# Patient Record
Sex: Female | Born: 1960 | Race: White | Hispanic: No | Marital: Married | Smoking: Former smoker
Health system: Southern US, Community
[De-identification: ages and names within clinical notes are randomized; demographics above are authoritative.]

## PROBLEM LIST (undated history)

## (undated) DIAGNOSIS — M810 Age-related osteoporosis without current pathological fracture: Secondary | ICD-10-CM

## (undated) DIAGNOSIS — G43909 Migraine, unspecified, not intractable, without status migrainosus: Secondary | ICD-10-CM

## (undated) DIAGNOSIS — Z862 Personal history of diseases of the blood and blood-forming organs and certain disorders involving the immune mechanism: Secondary | ICD-10-CM

## (undated) DIAGNOSIS — N200 Calculus of kidney: Secondary | ICD-10-CM

## (undated) DIAGNOSIS — G510 Bell's palsy: Secondary | ICD-10-CM

## (undated) DIAGNOSIS — J449 Chronic obstructive pulmonary disease, unspecified: Secondary | ICD-10-CM

## (undated) DIAGNOSIS — N83519 Torsion of ovary and ovarian pedicle, unspecified side: Secondary | ICD-10-CM

## (undated) DIAGNOSIS — K8020 Calculus of gallbladder without cholecystitis without obstruction: Secondary | ICD-10-CM

## (undated) HISTORY — DX: Calculus of kidney: N20.0

## (undated) HISTORY — DX: Bell's palsy: G51.0

## (undated) HISTORY — PX: LAPAROSCOPY: SHX197

## (undated) HISTORY — DX: Calculus of gallbladder without cholecystitis without obstruction: K80.20

## (undated) HISTORY — PX: CHOLECYSTECTOMY: SHX55

## (undated) HISTORY — DX: Torsion of ovary and ovarian pedicle, unspecified side: N83.519

## (undated) HISTORY — PX: TOTAL ABDOMINAL HYSTERECTOMY W/ BILATERAL SALPINGOOPHORECTOMY: SHX83

## (undated) HISTORY — DX: Personal history of diseases of the blood and blood-forming organs and certain disorders involving the immune mechanism: Z86.2

## (undated) HISTORY — PX: APPENDECTOMY: SHX54

## (undated) HISTORY — DX: Age-related osteoporosis without current pathological fracture: M81.0

## (undated) HISTORY — DX: Migraine, unspecified, not intractable, without status migrainosus: G43.909

## (undated) HISTORY — DX: Chronic obstructive pulmonary disease, unspecified: J44.9

---

## 1996-03-09 DIAGNOSIS — G510 Bell's palsy: Secondary | ICD-10-CM

## 1996-03-09 HISTORY — DX: Bell's palsy: G51.0

## 1999-10-13 ENCOUNTER — Other Ambulatory Visit: Admission: RE | Admit: 1999-10-13 | Discharge: 1999-10-13 | Payer: Self-pay | Admitting: Family Medicine

## 2000-02-13 ENCOUNTER — Other Ambulatory Visit: Admission: RE | Admit: 2000-02-13 | Discharge: 2000-02-13 | Payer: Self-pay | Admitting: Obstetrics and Gynecology

## 2003-04-06 ENCOUNTER — Ambulatory Visit (HOSPITAL_COMMUNITY): Admission: RE | Admit: 2003-04-06 | Discharge: 2003-04-06 | Payer: Self-pay | Admitting: Obstetrics and Gynecology

## 2003-04-06 ENCOUNTER — Emergency Department (HOSPITAL_COMMUNITY): Admission: EM | Admit: 2003-04-06 | Discharge: 2003-04-06 | Payer: Self-pay | Admitting: Emergency Medicine

## 2003-04-12 ENCOUNTER — Inpatient Hospital Stay (HOSPITAL_COMMUNITY): Admission: RE | Admit: 2003-04-12 | Discharge: 2003-04-14 | Payer: Self-pay | Admitting: Obstetrics and Gynecology

## 2003-04-12 ENCOUNTER — Encounter (INDEPENDENT_AMBULATORY_CARE_PROVIDER_SITE_OTHER): Payer: Self-pay | Admitting: Specialist

## 2003-04-18 ENCOUNTER — Emergency Department (HOSPITAL_COMMUNITY): Admission: EM | Admit: 2003-04-18 | Discharge: 2003-04-18 | Payer: Self-pay | Admitting: Emergency Medicine

## 2003-07-04 ENCOUNTER — Emergency Department (HOSPITAL_COMMUNITY): Admission: EM | Admit: 2003-07-04 | Discharge: 2003-07-04 | Payer: Self-pay | Admitting: Family Medicine

## 2003-08-06 ENCOUNTER — Ambulatory Visit (HOSPITAL_COMMUNITY): Admission: RE | Admit: 2003-08-06 | Discharge: 2003-08-06 | Payer: Self-pay | Admitting: Obstetrics and Gynecology

## 2004-02-03 ENCOUNTER — Inpatient Hospital Stay (HOSPITAL_COMMUNITY): Admission: AD | Admit: 2004-02-03 | Discharge: 2004-02-03 | Payer: Self-pay | Admitting: Obstetrics and Gynecology

## 2004-02-14 ENCOUNTER — Encounter (INDEPENDENT_AMBULATORY_CARE_PROVIDER_SITE_OTHER): Payer: Self-pay | Admitting: *Deleted

## 2004-02-15 ENCOUNTER — Inpatient Hospital Stay (HOSPITAL_COMMUNITY): Admission: RE | Admit: 2004-02-15 | Discharge: 2004-02-15 | Payer: Self-pay | Admitting: Obstetrics and Gynecology

## 2004-08-21 ENCOUNTER — Ambulatory Visit: Payer: Self-pay | Admitting: Family Medicine

## 2004-08-29 ENCOUNTER — Ambulatory Visit: Payer: Self-pay | Admitting: Family Medicine

## 2004-09-08 ENCOUNTER — Ambulatory Visit: Payer: Self-pay | Admitting: Family Medicine

## 2004-11-25 ENCOUNTER — Ambulatory Visit: Payer: Self-pay | Admitting: Family Medicine

## 2005-06-23 ENCOUNTER — Emergency Department (HOSPITAL_COMMUNITY): Admission: EM | Admit: 2005-06-23 | Discharge: 2005-06-24 | Payer: Self-pay | Admitting: Emergency Medicine

## 2005-08-07 ENCOUNTER — Encounter: Admission: RE | Admit: 2005-08-07 | Discharge: 2005-08-07 | Payer: Self-pay | Admitting: Obstetrics and Gynecology

## 2005-11-12 ENCOUNTER — Ambulatory Visit: Payer: Self-pay | Admitting: Family Medicine

## 2005-12-10 ENCOUNTER — Encounter (INDEPENDENT_AMBULATORY_CARE_PROVIDER_SITE_OTHER): Payer: Self-pay | Admitting: Specialist

## 2005-12-10 ENCOUNTER — Ambulatory Visit (HOSPITAL_COMMUNITY): Admission: RE | Admit: 2005-12-10 | Discharge: 2005-12-10 | Payer: Self-pay | Admitting: General Surgery

## 2007-06-20 ENCOUNTER — Encounter (INDEPENDENT_AMBULATORY_CARE_PROVIDER_SITE_OTHER): Payer: Self-pay | Admitting: Internal Medicine

## 2007-06-20 ENCOUNTER — Ambulatory Visit: Payer: Self-pay | Admitting: Family Medicine

## 2007-06-20 DIAGNOSIS — F17200 Nicotine dependence, unspecified, uncomplicated: Secondary | ICD-10-CM | POA: Insufficient documentation

## 2007-06-20 DIAGNOSIS — M810 Age-related osteoporosis without current pathological fracture: Secondary | ICD-10-CM | POA: Insufficient documentation

## 2007-06-20 DIAGNOSIS — D649 Anemia, unspecified: Secondary | ICD-10-CM

## 2007-06-21 LAB — CONVERTED CEMR LAB
Basophils Absolute: 0 10*3/uL (ref 0.0–0.1)
Basophils Relative: 0.3 % (ref 0.0–1.0)
Eosinophils Absolute: 0.1 10*3/uL (ref 0.0–0.7)
Eosinophils Relative: 1.3 % (ref 0.0–5.0)
HCT: 44.8 % (ref 36.0–46.0)
Hemoglobin: 15.2 g/dL — ABNORMAL HIGH (ref 12.0–15.0)
Lymphocytes Relative: 44.3 % (ref 12.0–46.0)
MCHC: 34 g/dL (ref 30.0–36.0)
MCV: 95.9 fL (ref 78.0–100.0)
Monocytes Absolute: 0.5 10*3/uL (ref 0.1–1.0)
Monocytes Relative: 6 % (ref 3.0–12.0)
Neutro Abs: 3.9 10*3/uL (ref 1.4–7.7)
Neutrophils Relative %: 48.1 % (ref 43.0–77.0)
Platelets: 206 10*3/uL (ref 150–400)
RBC: 4.67 M/uL (ref 3.87–5.11)
RDW: 12.9 % (ref 11.5–14.6)
WBC: 8 10*3/uL (ref 4.5–10.5)

## 2007-06-23 LAB — CONVERTED CEMR LAB: Vit D, 1,25-Dihydroxy: 36 (ref 30–89)

## 2007-07-26 ENCOUNTER — Encounter (INDEPENDENT_AMBULATORY_CARE_PROVIDER_SITE_OTHER): Payer: Self-pay | Admitting: *Deleted

## 2007-12-01 ENCOUNTER — Telehealth: Payer: Self-pay | Admitting: Internal Medicine

## 2008-12-26 ENCOUNTER — Ambulatory Visit: Payer: Self-pay | Admitting: Family Medicine

## 2008-12-26 DIAGNOSIS — H8110 Benign paroxysmal vertigo, unspecified ear: Secondary | ICD-10-CM

## 2009-11-27 ENCOUNTER — Ambulatory Visit: Payer: Self-pay | Admitting: Internal Medicine

## 2009-11-27 DIAGNOSIS — M79609 Pain in unspecified limb: Secondary | ICD-10-CM

## 2009-11-28 ENCOUNTER — Encounter: Payer: Self-pay | Admitting: Family Medicine

## 2009-11-28 ENCOUNTER — Ambulatory Visit: Payer: Self-pay

## 2009-11-28 LAB — CONVERTED CEMR LAB
BUN: 8 mg/dL (ref 6–23)
Basophils Absolute: 0.1 10*3/uL (ref 0.0–0.1)
Basophils Relative: 0.9 % (ref 0.0–3.0)
CO2: 29 meq/L (ref 19–32)
Calcium: 10.2 mg/dL (ref 8.4–10.5)
Chloride: 107 meq/L (ref 96–112)
Creatinine, Ser: 0.7 mg/dL (ref 0.4–1.2)
Eosinophils Absolute: 0.1 10*3/uL (ref 0.0–0.7)
Eosinophils Relative: 1.1 % (ref 0.0–5.0)
GFR calc non Af Amer: 92.37 mL/min (ref >60)
Glucose, Bld: 99 mg/dL (ref 70–99)
HCT: 43.1 % (ref 36.0–46.0)
Hemoglobin: 14.9 g/dL (ref 12.0–15.0)
Lymphocytes Relative: 30.3 % (ref 12.0–46.0)
Lymphs Abs: 2.7 10*3/uL (ref 0.7–4.0)
MCHC: 34.5 g/dL (ref 30.0–36.0)
MCV: 96.4 fL (ref 78.0–100.0)
Monocytes Absolute: 0.7 10*3/uL (ref 0.1–1.0)
Monocytes Relative: 7.3 % (ref 3.0–12.0)
Neutro Abs: 5.5 10*3/uL (ref 1.4–7.7)
Neutrophils Relative %: 60.4 % (ref 43.0–77.0)
Platelets: 216 10*3/uL (ref 150.0–400.0)
Potassium: 5.4 meq/L — ABNORMAL HIGH (ref 3.5–5.1)
RBC: 4.47 M/uL (ref 3.87–5.11)
RDW: 13.6 % (ref 11.5–14.6)
Sodium: 142 meq/L (ref 135–145)
TSH: 0.9 microintl units/mL (ref 0.35–5.50)
Total CK: 77 units/L (ref 7–177)
WBC: 9.1 10*3/uL (ref 4.5–10.5)

## 2009-12-05 ENCOUNTER — Ambulatory Visit: Payer: Self-pay | Admitting: Internal Medicine

## 2009-12-05 LAB — CONVERTED CEMR LAB: Potassium: 4.8 meq/L (ref 3.5–5.1)

## 2009-12-11 ENCOUNTER — Ambulatory Visit: Payer: Self-pay | Admitting: Internal Medicine

## 2009-12-12 ENCOUNTER — Telehealth: Payer: Self-pay | Admitting: Family Medicine

## 2010-04-08 NOTE — Progress Notes (Signed)
Summary: regarding chantix script  Phone Note From Pharmacy   Caller: [facility]  [address] [id number] Summary of Call: Pharmacy is calling for verification on chantix script.  They are asking if you want the starter pack or continuing month pack, they say directions are for starter pack but continuing pack was sent in. Initial call taken by: Lowella Petties CMA,  December 12, 2009 2:57 PM  Follow-up for Phone Call        I want continuing pack with directions sent in.  pt did not tolerate previous 1mg  dose two times a day.  ok to give loose pills in bottle. Follow-up by: Eustaquio Boyden  MD,  December 12, 2009 3:02 PM  Additional Follow-up for Phone Call Additional follow up Details #1::        Advised pharmacist, asked if ok to give loose pills in a bottle ranther than a pack.  Advised yes, per Dr. Reece Agar. Additional Follow-up by: Lowella Petties CMA,  December 12, 2009 3:09 PM

## 2010-04-08 NOTE — Miscellaneous (Signed)
Summary: Orders Update  Clinical Lists Changes  Problems: Added new problem of PVD (ICD-443.9) Orders: Added new Test order of Arterial Duplex Lower Extremity (Arterial Duplex Low) - Signed 

## 2010-04-08 NOTE — Assessment & Plan Note (Signed)
Summary: 2-3 WEEK FOLLOW UP ON LEGS/RBH   Vital Signs:  Patient profile:   50 year old female Weight:      110.50 pounds Temp:     98.4 degrees F oral Pulse rate:   60 / minute Pulse rhythm:   regular BP sitting:   104 / 62  (left arm) Cuff size:   regular  Vitals Entered By: Selena Batten Dance CMA (AAMA) (December 11, 2009 9:21 AM) CC: 2-3 week follow-up   History of Present Illness: CC: f/u leg pain L>R  1+ year h/o leg pain.  Recently worse over last weekned.  Still waking up in sleep.  Worse at night.  No sensation of restless legs.  L>R.  Doesn't feel "muscular" no joint pains.  No paresthesias, not charley horse sensation.  Pain dull achey throbby.  No significant leg swelling, no recent falls, trauma, etc.    Last visit sent for ABIs normal, basic blood work normal.  prescribed compression stockings, feels doing better but still in pain.  At night worse, uses heating pad for relief.  Thinks worse in L popliteal fossa area.    Smoker - 1/2 ppd.  interested in quitting.  No h/o DM, HTN.  osetoporosis - h/o using fosamax consistently x 2+ years.  asks about restarting.  has been off for 1+ year  Allergies: 1)  ! Penicillin  Past History:  Social History: Last updated: 11/27/2009 1/2 ppd smoking, no EtOH. Marital Status: Married Children: 2 Occupation: Theatre manager at [employer], broadcasting/film/video  Past Medical History: h/o migraines h/o osteoporosis Bell's palsy 1998 h/o nephrolithiasis h/o cholelithiasis h/o anemia (resolved as of 2011) PMH-FH-SH reviewed for relevance  Review of Systems       per HPI  Physical Exam  General:  alert, well-developed, well-nourished, and well-hydrated.  NAD Msk:  No deformity or scoliosis noted of thoracic or lumbar spine.  No tenderness at muscles of BLE, FROM at knees and hips. Pulses:  2+ DP/PT bilaterally Extremities:  no edema.  + marked vessels L>R legs.  no erythema or hyperpigmentation of legs.  Nontender to palpation.  No evidence  of baker's cyst bilaterally, negative homan sign, no palpable cords. Neurologic:  sensation intact bilaterally Skin:  Intact without suspicious lesions or rashes   Impression & Recommendations:  Problem # 1:  LEG PAIN, LEFT (ICD-729.5) ABIs f/o PAD.  ? vascular insufficiency pain 2/2 smoking.  slight improvement with compression stockings.  rec start horse chestnut seed extract daily, limit advil use given bleeding risk with both.  if not improving with these measures, consider [facility] referral for dopplers vs [place] looking for baker's cyst.  Problem # 2:  TOBACCO ABUSE (ICD-305.1) pt willing to retry chantix, at lower dose hopefully to circumvent previously nausea side effect.  rtc 1 mo for f/u.  Her updated medication list for this problem includes:    Chantix Continuing Month Pak 1 Mg Tabs (Varenicline tartrate) ..... Start with 1/2 pill daily for 3 days then 1/2 pill twice daily  Problem # 3:  OSTEOPOROSIS (ICD-733.00) hold off on restarting fosamax, concentrate on smoking cessation for now.  consider restarting in 1 year for another 2-3 years of bisphosphonate protection, once smoking resolved.  Her updated medication list for this problem includes:    Sm Calcium 600/vitamin D 600-400 Mg-unit Tabs (Calcium carbonate-vitamin d) .Marland Kitchen... Take one twice daily  Vit D:36 (06/20/2007)  Complete Medication List: 1)  Multivitamins Tabs (Multiple vitamin) .... Daily as remembers 2)  Compression Stockings Bilateral  ..Marland KitchenMarland Kitchen  20 mmhg 3)  Sm Calcium 600/vitamin D 600-400 Mg-unit Tabs (Calcium carbonate-vitamin d) .... Take one twice daily 4)  Chantix Continuing Month Pak 1 Mg Tabs (Varenicline tartrate) .... Start with 1/2 pill daily for 3 days then 1/2 pill twice daily 5)  H[employer]  .... 300mg  daily  Patient Instructions: 1)  return in 1 month for f/u on smoking and chantix. 2)  ABIs normal, blood work normal.  (copy provided) 3)  This could be vascular.  For this I recommend cutting  back on smoking as much as you can. 4)  Also consider horse chestnut seed extract (50mg  escin daily). 5)  Sent in script for chantix - take 1/2 pill twice daily 6)  We will hold off on fosamax for now.  Consider restarting in 1 year. Prescriptions: CHANTIX CONTINUING MONTH PAK 1 MG TABS (VARENICLINE TARTRATE) start with 1/2 pill daily for 3 days then 1/2 pill twice daily  #30 x 1   Entered and Authorized by:   Eustaquio Boyden  MD   Signed by:   Eustaquio Boyden  MD on 12/11/2009   Method used:   [facility]  [address] [phone] ([facility]ail[facility]       [address]       [place], [place]  [address]       Ph: [phone]       Fax: [phone]   RxID:   [id number]   Current Allergies (reviewed today): ! PENICILLIN

## 2010-04-08 NOTE — Assessment & Plan Note (Signed)
Summary: PAIN IN LEGS/BILLIE'S PT/CLE   Vital Signs:  Patient profile:   50 year old female Height:      63.75 inches Weight:      112 pounds BMI:     19.45 Temp:     98.0 degrees F oral Pulse rate:   72 / minute Pulse rhythm:   regular BP sitting:   110 / 80  (left arm) Cuff size:   regular  Vitals Entered By: Selena Batten Dance CMA Duncan Dull) (November 27, 2009 8:31 AM) CC: Leg pain Comments Left >right leg pain-doesn't feel muscular;patient feels that it is vascular in nature   History of Present Illness: CC: ?trouble ciruclation in legs L > R  1+ year h/o leg pain.  Recently worse over last weekned.  Wakes up in sleep.  Worse at night.  No sensation of restless legs.  L>R.  Doesn't feel "muscular" no joint pains.  No paresthesias, not charley horse sensation.  Pain dull achey throbby.  No significant leg swelling, no recent falls, trauma, etc.    Smoker - 1/2 ppd.  No h/o DM, HTN.  Current Medications (verified): 1)  None  Allergies: 1)  ! Penicillin  Past History:  Past Medical History: h/o anemia h/o migraines h/o osteoporosis  Past Surgical History: C/S x 2 hysterectomy with BSO (for ovarian torsion) Cholecystectomy  Social History: 1/2 ppd smoking, no EtOH. Marital Status: Married Children: 2 Occupation: Theatre manager at [employer], broadcasting/film/video  Review of Systems       per HPI  Physical Exam  General:  alert, well-developed, well-nourished, and well-hydrated.  NAD Lungs:  Normal respiratory effort, chest expands symmetrically. Lungs are clear to auscultation, no crackles or wheezes. Heart:  Normal rate and regular rhythm. S1 and S2 normal without gallop, murmur, click, rub or other extra sounds. Msk:  No deformity or scoliosis noted of thoracic or lumbar spine.  No tenderness at muscles of BLE, FROM at knees and hips. Pulses:  2+ DP/PT RLE, 2- DP/PT LLE Extremities:  no edema.  + marked vessels L>R legs.  no erythema or hyperpigmentation of legs.  Nontender to  palpation.  No evidence of baker's cyst bilaterally, negative homan sign, no palpable cords. Neurologic:  sensation intact bilaterally Skin:  Intact without suspicious lesions or rashes   Impression & Recommendations:  Problem # 1:  LEG PAIN, LEFT (ICD-729.5) Unsure etiology. Slightly diminised pulses L leg compared to R so sent for ABI (also given h/o smoker).  ? if venous insufficiency so have given prescription for compression stockings as well as instructed to keep legs elevated, drink plenty of fluids.  Check basic blood work to help r/o other causes (ie RLS, thyroid dysfunction, myalgias.)  RTC in 1 mo for f/u.  Orders: TLB-CBC Platelet - w/Differential (85025-CBCD) TLB-BMP (Basic Metabolic Panel-BMET) (80048-METABOL) TLB-TSH (Thyroid Stimulating Hormone) (84443-TSH) TLB-CK Total Only(Creatine Kinase/CPK) (82550-CK) Cardiology Referral (Cardiology)  Problem # 2:  OSTEOPOROSIS (ICD-733.00) rec cal/vit d daily.  The following medications were removed from the medication list:    Fosamax 70 Mg Tabs (Alendronate sodium) .Marland Kitchen... 1 x weekly for bones, take on empty stomach with 6-8oz water, remain upright for , may eat after Her updated medication list for this problem includes:    Sm Calcium 600/vitamin D 600-400 Mg-unit Tabs (Calcium carbonate-vitamin d) .Marland Kitchen... Take one twice daily  Vit D:36 (06/20/2007)  Problem # 3:  TOBACCO ABUSE (ICD-305.1)  Encouraged smoking cessation.  previously used chantix but made her nauseated so stopped (never tried 1/2  dose).  Problem # 4:  ANEMIA (ICD-285.9) h/o anemia - check again today (help r/o RLS).  If normal, could remove from problem list.  Orders: TLB-CBC Platelet - w/Differential (85025-CBCD)  Hgb: 15.2 (06/20/2007)   Hct: 44.8 (06/20/2007)   Platelets: 206 (06/20/2007) RBC: 4.67 (06/20/2007)   RDW: 12.9 (06/20/2007)   WBC: 8.0 (06/20/2007) MCV: 95.9 (06/20/2007)   MCHC: 34.0 (06/20/2007)  Complete Medication List: 1)   Multivitamins Tabs (Multiple vitamin) .... Daily as remembers 2)  Compression Stockings Bilateral  .... 20 mmhg 3)  Sm Calcium 600/vitamin D 600-400 Mg-unit Tabs (Calcium carbonate-vitamin d) .... Take one twice daily  Patient Instructions: 1)  Please return in 2-3 weeks for follow up on legs. 2)  I'm not quite sure what's causing your leg pain.   3)  We will draw blood today and send you to get ABIs done. 4)  Try elevating legs first thing you get home (and at work if possible). 5)  Also try compression stockings (prescription provided today) and stay away from salty foods. 6)  Smoking cessation is key to helping veins function properly.  [email or web address] for assistance, let us know if you want more help as well. 7)  Pleasure to meet you today.  Call clinic with questions. Prescriptions: COMPRESSION STOCKINGS BILATERAL 20 mmHg  #1 x 1   Entered and Authorized by:   Eustaquio Boyden  MD   Signed by:   Eustaquio Boyden  MD on 11/27/2009   Method used:   Print then Give to Patient   RxID:   [id number]   Current Allergies (reviewed today): ! PENICILLIN  Appended Document: old chart input    Clinical Lists Changes  Observations: Added new observation of PAST SURG HX: C/S x 2 hysterectomy with BSO (for ovarian torsion) Cholecystectomy  DEXA osteoporosis 09/2005 DEXA osteoporosis 08/2004 MRI Brain - Bells palsy 1998 (11/27/2009 17:55) Added new observation of PAST MED HX: h/o anemia h/o migraines h/o osteoporosis Bell's palsy 1998 h/o nephrolithiasis h/o cholelithiasis (11/27/2009 17:55) Added new observation of CALCIUM: 10.1 mg/dL (86/57/8469 62:95) Added new observation of ALBUMIN: 4.3 g/dL (28/41/3244 01:02) Added new observation of PROTEIN, TOT: 7.0 g/dL (72/53/6644 03:47) Added new observation of SGPT (ALT): 19 units/L (08/21/2004 17:55) Added new observation of SGOT (AST): 22 units/L (08/21/2004 17:55) Added new observation of ALK PHOS: 82 units/L (08/21/2004  17:55) Added new observation of BILI TOTAL: 0.7 mg/dL (42/59/5638 75:64) Added new observation of CREATININE: 0.9 mg/dL (33/29/5188 41:66) Added new observation of BUN: 6 mg/dL (09/06/1599 09:32) Added new observation of BG RANDOM: 92 mg/dL (35/57/3220 25:42) Added new observation of CL SERUM: 103 meq/L (08/21/2004 17:55) Added new observation of K SERUM: 4.6 meq/L (08/21/2004 17:55) Added new observation of NA: 138 meq/L (08/21/2004 17:55) Added new observation of PAP SMEAR: ASCUS  (10/13/1999 18:00)        -  Date:  10/13/1999    PAP ASCUS   Past History:  Past Medical History: h/o anemia h/o migraines h/o osteoporosis Bell's palsy 1998 h/o nephrolithiasis h/o cholelithiasis  Past Surgical History: C/S x 2 hysterectomy with BSO (for ovarian torsion) Cholecystectomy  DEXA osteoporosis 09/2005 DEXA osteoporosis 08/2004 MRI Brain - Bells palsy 1998

## 2010-06-04 ENCOUNTER — Encounter: Payer: Self-pay | Admitting: Family Medicine

## 2010-07-08 HISTORY — PX: LAPAROSCOPIC APPENDECTOMY: SUR753

## 2010-07-25 NOTE — Op Note (Signed)
NAME:  Sonya Hogan, Sonya Hogan                      ACCOUNT NO.:  [id number]   MEDICAL RECORD NO.:  [id number]                   PATIENT TYPE:  INP   LOCATION:  0005                                 FACILITY:  [facility]   PHYSICIAN:  Malachi Pro. Ambrose Mantle, M.D.              DATE OF BIRTH:  Feb 15, 1961   DATE OF PROCEDURE:  04/12/2003  DATE OF DISCHARGE:                                 OPERATIVE REPORT   PREOPERATIVE DIAGNOSES:  1. Menorrhagia.  2. Anemia.  3. Large pelvic mass, thought to be coming from the right ovary.  4. Cholelithiasis.  5. Possible right ureteral stone.   POSTOPERATIVE DIAGNOSES:  1. Menorrhagia.  2. Anemia.  3. Large pelvic mass, thought to be coming from the right ovary.  4. Possible right ureteral stone.  5. There was a torsion of the right tube and ovary causing necrosis of the     right tube and ovary.  6. Cholelithiasis.  7. No apparent dilatation of the right ureter.   OPERATION:  1. Abdominal hysterectomy.  2. Right salpingo-oophorectomy.   OPERATOR:  Malachi Pro. Ambrose Mantle, M.D.   ASSISTANT:  Zenaida Niece, M.D.   ANESTHESIA:  General.   DESCRIPTION OF PROCEDURE:  The patient was brought to the operating room and  placed under satisfactory general anesthesia, placed in frogleg position.  The abdomen, vulva, vagina, and urethra were prepped with Betadine solution,  and the bladder was catheterized with a Foley catheter hooked to straight  drain.  The patient was placed supine.  The midline incision was made and  carried in layers through the skin, subcutaneous tissue, and fascia and into  the peritoneal cavity after the area was draped as a sterile field.  There  was some bloody acidic fluid on entering the abdominal cavity.  Exploration  of the upper abdomen revealed the liver to be smooth without abnormalities.  The gallbladder had small stones present.  Both kidneys felt normal.  Exploration of the pelvis revealed the uterus to be anterior, normal size.  The left tube and ovary were normal except the left tube had been previously  ligated.  The right tube and ovary were twisted close to the uterotubal and  uteroovarian junction.  The mass combining the right tube and ovary was  probably about 10 x 6-8 cm.  The colon was a dark maroon color indicating  necrosis.  The posterior cul-de-sac was free of disease.  The anterior cul-  de-sac was markedly scarred secondary to her previous C-sections.  The right  round ligament was divided.  The right infundibulopelvic ligament was  divided between clamps superior to the ureter.  The right tube and ovary  were then removed by transecting the uteroovarian ligament and the  mesosalpinx between clamps.  The specimen was sent for frozen section.  There was a clot present in the vessels but not in the infundibulopelvic  ligament as far as I could tell.  Both uterine vessels were skeletonized,  clamped, cut, and suture ligated.  Parametrial tissues were clamped, cut,  and suture ligated, and the uterosacral ligaments were clamped, cut, suture  ligated, and held.  The right vaginal angle was entered after the rectum was  pushed posteriorly, and the bladder was pushed well inferior to the  operative field.  The left vaginal angle was entered, and then the uterus  was removed by transecting the upper vagina.  Vaginal angle sutures were  placed, and then the central portion of the vagina was closed with  interrupted figure-of-eight sutures of 0 Vicryl.  There was very little  bleeding.  A couple of spots required electrical current to complete  hemostasis.  The uterosacral ligaments were sutured together in the midline  with two interrupted sutures of 0 Vicryl.  The distal left fallopian tube  and the left ovary looked normal.  Reperitonealization was done across the  vaginal cuff.  Both ureters were palpated and visualized and were normal.  Frozen section report came back as no evidence of tumor.  The tissue  was so  edematous and necrotic, it was hard to tell the exact nature of the tissue,  but there was no evidence of any tumor formation or malignancy.  Pelvic  washings which had been obtained on entering the abdominal cavity were  discarded.  Packs and retractors were removed, and the abdominal wall was  closed with Marcello Moores sutures of #1 Novofil in a far-far-near-near type  pattern, and then a couple of extra sutures were placed through the fascia  in areas where the Tom Jones sutures were not close together.  The  subcutaneous tissue was closed with a running 3-0 Vicryl, and the skin was  closed with automatic staples.  The patient seemed to tolerate the procedure  well and was returned to recovery in satisfactory condition.  Dr. Thornton Dales had been on standby in case nodal tissue was necessary in case of  malignancy.  He was informed at the time of the frozen section that we no  longer needed his services but that I would have the patient see him  regarding her cholelithiasis.  Blood loss about 100 mL.                                               Malachi Pro. Ambrose Mantle, M.D.    TFH/MEDQ  D:  04/12/2003  T:  04/12/2003  Job:  [id number]   cc:   Anselm Pancoast. Zachery Dakins, M.D.  [address], [address]  [place]  [place] [address]  Fax: [phone]

## 2010-07-25 NOTE — H&P (Signed)
NAME:  Sonya Hogan, Sonya Hogan                      ACCOUNT NO.:  [id number]   MEDICAL RECORD NO.:  [id number]                   PATIENT TYPE:  INP   LOCATION:  NA                                   FACILITY:  [facility]   PHYSICIAN:  Malachi Pro. Ambrose Mantle, M.D.              DATE OF BIRTH:  06/18/1960   DATE OF ADMISSION:  DATE OF DISCHARGE:                                HISTORY & PHYSICAL   ANTICIPATED DATE OF ADMISSION:  April 12, 2003.   HISTORY OF PRESENT ILLNESS:  This patient is a 50 year old white married  female para 2, 0, 0, 2 seen with right lower quadrant abdominal pain of  seven hours duration on April 06, 2003.  The patient had the acute onset  of sudden right lower abdominal pain in the morning of April 06, 2003.  This caused almost to black out and she had associated nausea and vomiting.  Her last period had 10 days before, lasting six days and medium flow;  previous menstrual period one month before that for six days, very heavy for  five to eight pads per day, and she passed some golf ball-sized clots.  After the onset of the patient's pain on April 06, 2003 [facility] and there she received a CT scan that showed a pelvic mass  most likely arising from the right ovary.  She also was noted to have mild  right hydroureter and a possible calculus in the distal right ureter.  She  also had had swelling of the right S2 nerve root sleeve.  When I was called  by the [facility] physician I was not told about the hydroureter or  the swelling of the S2 nerve root sleeve, only the pelvic mass.  The patient  came to my office where she was in considerable pain.  I sent her to [facility] where she underwent an ultrasound exam that showed a complex cystic  and solid lesion in the right adnexa.  No definite tubular structure could  be identified to suggest a pyosalpinx of tubo-ovarian abscess.  It was not  hyperemic, although there was a detectable  Doppler signal within the non  cystic components of the lesion; cystic ovarian neoplasm was considered.  The features of the mass were not felt to be consistent with ovarian  torsion.  The patient was advised to proceed with surgery and she is  admitted for that now.   ALLERGIES:  Allergy to PENICILLIN.   PAST SURGICAL HISTORY:  Tubal ligation, conization and C section times two.   PAST MEDICAL HISTORY:  Bell's palsy seven years ago.   HABITS:  The patient drinks occasional alcohol. Smokes 1.2 pack of  cigarettes a day.  She does not use drugs.   MEDICATIONS:  Advil.   FAMILY HISTORY:  Mother 18 living and well.  Father 65 living and well.  Two  brothers; one died  at age 18 of testicular cancer.   REVIEW OF SYSTEMS:  The patient does have migraines.  She coughs frequently.  She has no intestinal or urinary problems.   PHYSICAL EXAMINATION:  VITAL SIGNS:  Physical exam reveals the patient is  afebrile with normal vital signs.  HEENT:  Head, eyes, ears, nose and throat reveal no cranial abnormalities.  Extraocular movements are intact.  Nose and pharynx clear.  There is an  upper dental plate.  NECK:  Neck supple without thyromegaly.  HEART:  Normal size and sounds.  No murmurs.  LUNGS:  Lungs clear to P&A.  ABDOMEN:  Abdomen is soft and not tender.  There is no rebound tenderness.  No mass is palpable.  PELVIC EXAMINATION:  Vulva and vagina are clean.  BUS negative.  Cervix is  clear.  Uterus anterior, normal size.  There is a cul-de-sac mass thought to  be about 5 cm in diameter, although by ultrasound it was thought to be about  9.2 x 2.3 x 6.4 cm.   ADMITTING IMPRESSION:  1. Right lower abdominal pain.  2. Pelvic mass.  3. Also, it should be noted that on the patient's computerized tomographic     scan she was noted to have gallstones, which were not reported to be on     the report from the emergency room.   The patient has been informed of the impending surgery.  She  is to make a  decision if the lesion is benign whether to remove just the pelvic mass or  whether to also remove the uterus.  She knows that if it is malignant that a  hysterectomy, bilateral salpingo-oophorectomy, omentectomy, and lymph node  biopsies will be done.  She knows that Dr. Zachery Dakins is standing by for  assistance.  She has also been informed that if her pain persists after the  mass is removed that she will need to undergo evaluation for a right  ureteral stone.  She understands that the gallstones might be removed, but  probably not and that she will need further evaluation of the swelling of  the right S2 nerve root sleeve.  By CT scan there does not appear to be any  omental caking, there is no ascites and no lymphadenopathy.  The patient  understands the risks of surgery including, but not limited to heart attack,  stroke, pulmonary embolus, wound disruption, hemorrhage with need for  reoperation and/or transfusion, fistula formation, nerve injury, and  intestinal obstruction.  She also understands the potential for infection  and she is ready to proceed.                                               Malachi Pro. Ambrose Mantle, M.D.    TFH/MEDQ  D:  04/11/2003  T:  04/11/2003  Job:  [id number]

## 2010-07-25 NOTE — Op Note (Signed)
Sonya Hogan, Sonya Hogan            ACCOUNT NO.:  [id number]   MEDICAL RECORD NO.:  [id number]          PATIENT TYPE:  AMB   LOCATION:  DAY                          FACILITY:  [facility]   PHYSICIAN:  Anselm Pancoast. Weatherly, M.D.DATE OF BIRTH:  1960/12/02   DATE OF PROCEDURE:  12/10/2005  DATE OF DISCHARGE:  12/10/2005                                 OPERATIVE REPORT   PREOPERATIVE DIAGNOSIS:  Chronic cholecystitis with stones.   POSTOPERATIVE DIAGNOSIS:  Chronic cholecystitis with stones.   OPERATION:  Laparoscopic cholecystectomy with cholangiogram.   SURGEON:  Consuello Bossier, M.D.   ASSISTANT:  Kendrick Ranch, M.D.   ANESTHESIA:  General anesthesia.   HISTORY:  Sonya Hogan is a 50 year old Caucasian female who was  referred to me for recurrent episodes of right upper quadrant pain.  Approximately 2 years ago, she had had problems with an ovarian cyst and  this ultimately had a abdominal hysterectomy with bilateral salpingo-  oophorectomy by Dr. Ambrose Mantle and she now is having episodes of right upper  quadrant pain certainly consistent with episodes of gallbladder attacks.  I  saw her and reviewed her CT that she had done 2 years ago and she has  obvious stones within her gallbladder with calcium at that time.  Her  symptoms were fairly classical for episodes of biliary colic and I  recommended we just proceed on with a laparoscopic cholecystectomy and  cholangiogram.  Repeat CBC and liver function studies are normal.  Intermittently, she will have some kind of cramping in the right lower  quadrant, but nothing of any significance and she is completely asymptomatic  in the lower abdominal tenderness at this time.   DESCRIPTION OF PROCEDURE:  I recommended we proceed on and preop, she was  given a gram of Kefzol and positioned on the OR table with induction of  general anesthesia.  An endotracheal tube was placed and an oral tube into  the abdomen and the abdomen was prepped with  Betadine surgical scrubbing  solution and draped in a sterile manner.  She had had a lower midline  incision and I opened up the immediate subumbilical area, identified the  little suture line and actually removed one of the little Prolene sutures as  we carefully entered into the peritoneal cavity.  She does have adhesions at  the midline, but we were up above that and a pursestring suture of 0 Vicryl  was placed and a Hasson cannula introduced.  The gallbladder was collapsed.  There were adhesions around it and these were carefully taken down and then  the upper 10-mL trocar had been placed under direct vision after  anesthetizing the fascia and the 2 lateral 5-mm trocars were placed in the  appropriate position by Dr. Earlene Plater.  The gallbladder was retracted upward and  outward.  The adhesions were carefully divided with hook electrocautery and  then the proximal cystic duct junction of the gallbladder was identified.  The cystic artery was likewise identified and I put a clip on it proximally  and then encompassed the cystic duct, clipped it at the junction of the  gallbladder and  then a little opening made.  A Cook catheter was introduced,  held in place with a clip; x-ray was obtained and it showed good prompt  filling of the extrahepatic biliary system with good flow into the duodenum  and the intrahepatic radicles were visualized nicely.  I removed catheter,  triply clipped the cystic duct under direct vision, divided it, put an  additional clip on the cystic artery proximally and then divided the cystic  artery distal to the 2 clips, then I also identified a little posterior  branch of the cystic artery that was doubly clipped proximally and divided  this and then removed the gallbladder from its bed.  Good hemostasis was  obtained.  We grasped the gallbladder with the grasper and brought it out at  the umbilicus.  There were some stones that were big enough that we had to  dilate the  fascia just slightly, but able to bring out the gallbladder  intact.  I put an additional figure-of-eight suture of 0 Vicryl in the  fascia at the umbilicus, tied both it and the pursestring and then released  the carbon dioxide, aspirated the little bit of irrigating fluid,  reinspected the bed. the clips and etc, and they were all in position with  no evidence any bleeding, bile, etc.  We then removed the 5-mm ports under  direct vision, released the carbon dioxide again and then withdrew the upper  10-mm trocar under direct vision.  I did place a figure-of-eight suture in  the fascia at the subxiphoid area, since she is very thin.  The subcutaneous  wounds were closed with 4-0 Vicryl.  Benzoin and Steri-Strips were placed on  skin.  The patient tolerated the procedure nicely and will be released after  a short stay in the recovery room if she desires this afternoon.  I will see  her back in the office in approximately 2 weeks and she should be able to  return to work next week.           ______________________________  Anselm Pancoast. Zachery Dakins, M.D.     WJW/MEDQ  D:  12/10/2005  T:  12/12/2005  Job:  [id number]   cc:   Malachi Pro. Ambrose Mantle, M.D.  Fax: [phone]

## 2010-07-25 NOTE — Discharge Summary (Signed)
NAMEELANTRA, CAPRARA            ACCOUNT NO.:  [id number]   MEDICAL RECORD NO.:  [id number]          PATIENT TYPE:  INP   LOCATION:  0470                         FACILITY:  [facility]   PHYSICIAN:  Malachi Pro. Ambrose Mantle, M.D. DATE OF BIRTH:  04-15-1960   DATE OF ADMISSION:  02/14/2004  DATE OF DISCHARGE:  02/15/2004                                 DISCHARGE SUMMARY   HISTORY:  A 50 year old white female admitted with a persistent left adnexal  mass that was quite painful.   HOSPITAL COURSE:  The patient underwent laparotomy with finding of a torsion  of the left adnexa with dense adherence to the sigmoid colon, the pelvis and  the left pelvic wall.  The left tube and ovary were removed intact with a  portion of the left round ligament. Frozen section diagnosis was benign,  although the pathology was compromised by the fact that the tissue was  necrotic and hemorrhagic.  The patient did well postoperatively, tolerating  a liquid diet, ambulated well without difficulty, voided well. On the first  postop evening, she was ready for discharge.   LABORATORY DATA:  Initial hemoglobin of 14.4, hematocrit 42.0, normal  differential, white count 10,600, platelet count 323,000.  Follow-up  hematocrits 36.8 and 34.4, on the evening of the day of surgery and on the  morning of the first postop day.  No other labs were done.  Pathology report  is pending. The pelvic washings were negative for malignancy.   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   OPERATION:  Laparotomy, division of adhesions, left salpingo-oophorectomy.   FINAL CONDITION:  Improved.   DISCHARGE INSTRUCTIONS:  Liquid diet until passing flatus or having bowel  movements, report any temperature above 100.4 degrees, report any unusual  problems, avoid vaginal entrance, avoid heavy lifting with strenuous  activity, use of incentive spirometry, cough and deep breathe by bending  forward and  holding her incision.  Return in 6 days for staple remove and  Percocet 5/325, 24 tablets, 1 every 4-6 hours as needed for pain.     Scharlene Corn   TFH/MEDQ  D:  02/15/2004  T:  02/16/2004  Job:  [id number]

## 2010-07-25 NOTE — Op Note (Signed)
NAMEMEILYN, Sonya Hogan            ACCOUNT NO.:  [id number]   MEDICAL RECORD NO.:  [id number]          PATIENT TYPE:  OBV   LOCATION:  0470                         FACILITY:  [facility]   PHYSICIAN:  Malachi Pro. Ambrose Mantle, M.D. DATE OF BIRTH:  January 11, 1961   DATE OF PROCEDURE:  02/14/2004  DATE OF DISCHARGE:                                 OPERATIVE REPORT   PREOPERATIVE DIAGNOSIS:  Left adnexal mass, symptomatic with pain, enlarging  on ultrasounds done in October and December.   POSTOPERATIVE DIAGNOSES:  1.  Torsion of the left adnexa.  2.  Omental adhesions to the abdominal wall.  3.  Adhesion of the left adnexal torsion to the sigmoid colon and pelvic      wall.   OPERATION:  1.  Laparotomy.  2.  Lysis of adhesions.  3.  Left salpingo-oophorectomy.   OPERATOR:  Malachi Pro. Ambrose Mantle, M.D.   ASSISTANT:  Zenaida Niece, M.D.   ANESTHESIA:  General.   DESCRIPTION OF PROCEDURE:  The patient was brought to the operating room and  placed under satisfactory general anesthesia.  The patient was then placed  in frogleg position.  The abdomen was prepped with Betadine solution.  The  urethra was prepped, and a Foley catheter was inserted to straight drain.  The abdomen was draped as a sterile field.  The patient was placed supine.  She had an old midline incision from the pubis to the umbilicus.  I elected  to use the lower half of the incision.  The lower half incisional scar was  slightly wide, and I did remove this as I entered the abdomen.  I then  incised the fashion vertically and incised the peritoneum sharply.  The  incision was so small, I could not examine the upper abdomen, but the  omentum was adherent to the undersurface of the abdominal wall.  I divided  some of these adhesions enough so that I could insert a retractor, and then  I irrigated with saline and obtained pelvic washings.  What appeared to be  an infarcted hemorrhagic mass was right underneath the inferior part of  the  incision at the level of the abdominal wall.  I then inspected the mass  which was probably at least 10 cm in diameter, and it was fairly densely  adherent to the sigmoid colon and to the pelvic wall and into the deep  pelvis.  I divided some of the dense adhesions between clamps and then  ligated the adhesions after they were cut.  Some of the adhesion binding  this what appeared to be a hemorrhagic infarcted mass with blunt dissection  because they were quite flimsy.  I was able to elevate the mass out of the  pelvis, and it appeared then that there was twisting of the mass on its  blood supply.  I isolated the left infundibulopelvic ligament, kept the  ureter well below the infundibulopelvic ligament, and then doubly suture  ligated the left infundibulopelvic ligament after it was cut.  The round  ligament on the left seemed to be involved with the mass, so I divided the  round ligament closer to the pelvic wall then the mass, and this allowed the  entire mass to be removed.  I then sent it for frozen section, and the  report was that it was an infarcted mass, and histology was very difficult,  but no obvious malignancy was present, but it was consistent with a torsion  of the left adnexa.  I inspected the left ureter.  It was free of the  dissection.  I saw the right ureter.  There was slight bleeding from where  the mass had been adherent in the pelvis as much as could be.  This was  stopped with the electrical current, and then I placed a piece of Surgicel  over an especially raw area where I had difficulty completing hemostasis.  There was only a very slight ooze at this point before I put the Surgicel  in.  All retractors and packs were removed.  The abdominal wall was closed  with Marcello Moores sutures of #1 Novofil, although I could not get the  peritoneum in all the sutures because it was poorly developed secondary to  the omental adhesions that had been present on entry into  the abdominal  wall.  The subcu tissue was closed with a running 3-0 Vicryl, and the skin  was closed with automatic staples.  The patient seemed to tolerate the  procedure well.  Blood loss was estimated at no more than 100 mL.  Sponge  and needle counts were correct, and the patient was returned to recovery in  satisfactory condition.     Scharlene Corn   TFH/MEDQ  D:  02/14/2004  T:  02/14/2004  Job:  [id number]

## 2010-07-25 NOTE — H&P (Signed)
Sonya Hogan, Sonya Hogan            ACCOUNT NO.:  [id number]   MEDICAL RECORD NO.:  [id number]          PATIENT TYPE:  AMB   LOCATION:  DAY                          FACILITY:  [facility]   PHYSICIAN:  Malachi Pro. Ambrose Mantle, M.D. DATE OF BIRTH:  Jun 02, 1960   DATE OF ADMISSION:  DATE OF DISCHARGE:                                HISTORY & PHYSICAL   HISTORY OF PRESENT ILLNESS:  A 50 year old white female admitted to the  hospital for removal of the left pelvic mass.  This patient had torsion of  her right ovary in February, 2005.  Because of the history of severe  menorrhagia, she underwent abdominal hysterectomy and right salpingo-  oophorectomy.  The path showed benign ovarian stroma, but the ovary was  extremely hemorrhagic, lacking viable epithelial lining.  The uterus showed  focal serosal fibrous adhesions but no true abnormality.  Postoperatively,  the patient did well but over the last 10 months, she has been bothered by  recurrent left lower abdominal pain that has been associated with an  enlargement of her left ovary.  This has been confirmed on three occasions  with ultrasound.  The first time, the ultrasound was relatively unremarkable  in that the ovarian cyst had already diminished in size but recently, the  patient's left ovary has been enlarged on December 24, 2003 and on February 11, 2004, and on February 11, 2004, the entire ovary measured 8.6 x 7.6 x 4.4  cm, including four complex cysts and a pocket of echo-free fluid.  The  patient is admitted now for left salpingo-oophorectomy.  There is a  possibility  that the mass will diminish in size prior to surgery, but even  if that is the case, the left tube and ovary are going to be removed because  of the recurrent left lower abdominal pain she has experienced.   The past medical history reveals an allergy to PENICILLIN.   PAST SURGICAL HISTORY:  Tubal ligation, conization, two cesarean sections,  abdominal hysterectomy, right  salpingo-oophorectomy.   PAST MEDICAL HISTORY:  Bell's palsy, cholelithiasis, and nephrolithiasis.   The patient occasionally drinks alcohol, smokes a half-pack of cigarettes a  day.  Does not use drugs.   MEDICATIONS:  Advil and recurrent use of Vicodin when she has had the  painful left ovary.   FAMILY HISTORY:  Mother and father 20 and 64, living and well.  Two  brothers, one of whom died at age 74 of testicular cancer.   Patient has migraines.  She coughs frequently.  She has no intestinal or  urinary problems but has recurrent pain in the left lower abdomen.  She has  not been symptomatic from the kidney stones or the gallstones.   PHYSICAL EXAMINATION:  VITAL SIGNS:  Normal.  GENERAL:  The patient is a well-developed, slender white female in no acute  distress.  HEENT:  No cranial abnormalities.  Extraocular movements are intact.  There  is an upper dental plate.  NECK:  Supple without thyromegaly.  LUNGS:  Clear to auscultation.  HEART:  Normal size and sounds.  No murmurs.  ABDOMEN:  Soft and nontender.  There is no rebound tenderness.  No mass is  palpable.  PELVIC:  There is a large pelvic mass.  The vulva and vagina are clean.  RECTAL:  Negative.   ADMISSION IMPRESSION:  Large pelvic mass that is symptomatic recurrently  with severe left lower abdominal pain.   The patient is admitted for left salpingo-oophorectomy.  We will possibly  start with a laparoscopy to see if the mass can be removed laparoscopically  and then proceed to left salpingo-oophorectomy, either by laparoscopy or  laparotomy.  She has also been informed that omentectomy and lymph node  dissection would be done if the mass is malignant.  She has been informed of  the risks of surgery, including but not limited to heart attack, stroke,  pulmonary embolus, wound disruption, hemorrhage or need for reoperation  and/or transfusion, fistula formation, nerve injury, and intestinal  obstruction.  She has  also been informed that the surgery could have an  unpredictable impact on her sex drive.  She understands and agrees to  proceed.      TFH/MEDQ  D:  02/13/2004  T:  02/13/2004  Job:  [id number]

## 2010-07-25 NOTE — Discharge Summary (Signed)
NAME:  Sonya Hogan, Sonya Hogan                      ACCOUNT NO.:  [id number]   MEDICAL RECORD NO.:  [id number]                   PATIENT TYPE:  INP   LOCATION:  0466                                 FACILITY:  [facility]   PHYSICIAN:  Malachi Pro. Ambrose Mantle, M.D.              DATE OF BIRTH:  [birth year]   DATE OF ADMISSION:  04/12/2003  DATE OF DISCHARGE:  04/14/2003                                 DISCHARGE SUMMARY   HOSPITAL COURSE:  A 50 year old white female admitted because of a severe  right lower abdominal pain of acute onset, pelvic mass, history of  menorrhagia and dysmenorrhea.  The patient wanted to have a hysterectomy  along with the removal of the pelvic mass and if it was malignant she was  prepared for omentectomy and lymph node dissection.  Dr. Zachery Dakins was on  standby.  The patient underwent a laparotomy on April 12, 2003 through a  midline incision, pelvic washings were obtained.  The pelvic mass was  secondary to a torsion of the right adnexa.  The right tube and ovary were  removed and sent for frozen section, they were considered benign by Dr.  Alfred Levins.  So abdominal hysterectomy was performed.  Blood was about 100  mL.  Postoperatively, the patient did have an elevated temperature the night  of surgery to 100.7 and the first postop night to 101.7.  But during the  night the night after surgery she coughed a large amount of green mucus and  since then has been afebrile.  By changing her medication from morphine to  Percocet and then to Demerol, her nausea cleared.  She now feels well, is  ambulating well without difficulty, has voided well, and is ready for  discharge on the evening of the second postoperative day.  Cathed urine on  the day of discharge was completely negative.  White count on the day of  discharge was 11,100, hemoglobin 8.3, hematocrit 26.2, platelet count  225,000.  A basal metabolic profile was completely normal except for a  glucose of 122 with an IV  running.  Hemoglobin on admission 9.6, hematocrit  30.9, white count 7800, platelet count 342,000.  The patient was screened  because of a low blood count on admission.  She was O positive with a  negative antibody.  Chest x-ray showed minimal platelike atelectasis in the  right middle lobe.  EKG showed a sinus bradycardia with a nonspecific T-wave  abnormality with no previous tracing.  Path report is pending.   FINAL DIAGNOSES:  1. Large pelvic mass secondary to torsion of the right adnexa.  2. Severe right lower abdominal pain secondary to the torsion.  3. Menorrhagia.  4. Dysmenorrhea.   OPERATION:  Abdominal hysterectomy, right salpingo-oophorectomy.   FINAL CONDITION:  Improved.   INSTRUCTIONS:  Include our regular discharge instruction booklet.  The  patient has passed flatus but she is advised to stay on liquids until she is  passing flatus freely or has a bowel movement.  She is encouraged to refrain  from heavy lifting or strenuous activity, avoid vaginal entrance, report any  temperature elevation greater than 101 degrees, report any unusual problems,  and return to the office in 5 days for staple removal.   MEDICATIONS AT DISCHARGE:  1. Demerol 50 mg p.o. q.4-6 h. as needed for pain.  2. Phenergan 25 mg q.4-6 h. as needed for nausea.                                               Malachi Pro. Ambrose Mantle, M.D.    TFH/MEDQ  D:  04/14/2003  T:  04/15/2003  Job:  [id number]

## 2010-08-01 ENCOUNTER — Emergency Department (HOSPITAL_COMMUNITY): Payer: BC Managed Care – PPO

## 2010-08-01 ENCOUNTER — Ambulatory Visit: Payer: Self-pay | Admitting: Family Medicine

## 2010-08-01 ENCOUNTER — Other Ambulatory Visit (INDEPENDENT_AMBULATORY_CARE_PROVIDER_SITE_OTHER): Payer: Self-pay | Admitting: Surgery

## 2010-08-01 ENCOUNTER — Observation Stay (HOSPITAL_COMMUNITY)
Admission: EM | Admit: 2010-08-01 | Discharge: 2010-08-02 | Disposition: A | Discharge status: Home / Self Care | Payer: BC Managed Care – PPO | Attending: General Surgery | Admitting: General Surgery

## 2010-08-01 ENCOUNTER — Encounter (HOSPITAL_COMMUNITY): Payer: Self-pay | Admitting: Radiology

## 2010-08-01 DIAGNOSIS — K3580 Unspecified acute appendicitis: Principal | ICD-10-CM | POA: Insufficient documentation

## 2010-08-01 DIAGNOSIS — F17200 Nicotine dependence, unspecified, uncomplicated: Secondary | ICD-10-CM | POA: Insufficient documentation

## 2010-08-01 DIAGNOSIS — G43909 Migraine, unspecified, not intractable, without status migrainosus: Secondary | ICD-10-CM | POA: Insufficient documentation

## 2010-08-01 DIAGNOSIS — Z8673 Personal history of transient ischemic attack (TIA), and cerebral infarction without residual deficits: Secondary | ICD-10-CM | POA: Insufficient documentation

## 2010-08-01 LAB — CBC
HCT: 44 % (ref 36.0–46.0)
Hemoglobin: 14.8 g/dL (ref 12.0–15.0)
MCH: 31.3 pg (ref 26.0–34.0)
MCHC: 33.6 g/dL (ref 30.0–36.0)
MCV: 93 fL (ref 78.0–100.0)
Platelets: 221 10*3/uL (ref 150–400)
RBC: 4.73 MIL/uL (ref 3.87–5.11)
RDW: 13 % (ref 11.5–15.5)
WBC: 13.3 10*3/uL — ABNORMAL HIGH (ref 4.0–10.5)

## 2010-08-01 LAB — DIFFERENTIAL
Basophils Absolute: 0.1 10*3/uL (ref 0.0–0.1)
Eosinophils Absolute: 0.1 10*3/uL (ref 0.0–0.7)
Eosinophils Relative: 1 % (ref 0–5)
Lymphocytes Relative: 23 % (ref 12–46)
Lymphs Abs: 3 10*3/uL (ref 0.7–4.0)
Monocytes Absolute: 1.1 10*3/uL — ABNORMAL HIGH (ref 0.1–1.0)
Monocytes Relative: 8 % (ref 3–12)
Neutro Abs: 9.1 10*3/uL — ABNORMAL HIGH (ref 1.7–7.7)
Neutrophils Relative %: 68 % (ref 43–77)

## 2010-08-01 LAB — URINALYSIS, ROUTINE W REFLEX MICROSCOPIC
Bilirubin Urine: NEGATIVE
Glucose, UA: NEGATIVE mg/dL
Ketones, ur: NEGATIVE mg/dL
Nitrite: NEGATIVE
Specific Gravity, Urine: 1.01 (ref 1.005–1.030)
Urobilinogen, UA: 0.2 mg/dL (ref 0.0–1.0)
pH: 7 (ref 5.0–8.0)

## 2010-08-01 LAB — COMPREHENSIVE METABOLIC PANEL
ALT: 13 U/L (ref 0–35)
AST: 15 U/L (ref 0–37)
Albumin: 4.2 g/dL (ref 3.5–5.2)
BUN: 8 mg/dL (ref 6–23)
CO2: 27 mEq/L (ref 19–32)
Calcium: 9.7 mg/dL (ref 8.4–10.5)
Chloride: 106 mEq/L (ref 96–112)
Creatinine, Ser: 0.62 mg/dL (ref 0.4–1.2)
GFR calc Af Amer: >60 mL/min (ref >60)
GFR calc non Af Amer: >60 mL/min (ref >60)
Potassium: 3.9 mEq/L (ref 3.5–5.1)
Sodium: 142 mEq/L (ref 135–145)
Total Bilirubin: 0.4 mg/dL (ref 0.3–1.2)
Total Protein: 7 g/dL (ref 6.0–8.3)

## 2010-08-01 LAB — URINE MICROSCOPIC-ADD ON

## 2010-08-01 MED ORDER — IOHEXOL 300 MG/ML  SOLN
100.0000 mL | Freq: Once | INTRAMUSCULAR | Status: AC | PRN
  Administered 2010-08-01: 100 mL via INTRAVENOUS

## 2010-08-02 LAB — CBC
HCT: 34.7 % — ABNORMAL LOW (ref 36.0–46.0)
MCHC: 33.1 g/dL (ref 30.0–36.0)
MCV: 95.3 fL (ref 78.0–100.0)
RDW: 13.3 % (ref 11.5–15.5)

## 2010-08-02 LAB — URINE CULTURE

## 2010-08-05 ENCOUNTER — Encounter: Payer: Self-pay | Admitting: Family Medicine

## 2010-08-05 NOTE — Discharge Summary (Signed)
  [name], Sonya Hogan            ACCOUNT NO.:  [id number]  MEDICAL RECORD NO.:  [id number]           PATIENT TYPE:  O  LOCATION:  5153                         FACILITY:  [facility]  PHYSICIAN:  Sandria Bales. Ezzard Standing, M.D.  DATE OF BIRTH:  1961-01-16  DATE OF ADMISSION:  08/01/2010 DATE OF DISCHARGE:  08/02/2010                              DISCHARGE SUMMARY   DISCHARGE DIAGNOSES: 1. Acute appendicitis. 2. Smokes cigarettes, knows it is bad for health.  OPERATIONS PERFORMED:  Laparoscopic appendectomy on Aug 01, 2010 by Dr. Barrie Dunker.  HISTORY OF ILLNESS:  This is a 50 year old white female who was a former patient of Dr. 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  She developed abdominal pain which awoke her at about 2 or 3 a.m. on the Aug 01, 2010.  She presented to the [facility] Emergency Room where CT scan revealed appendicitis.  Her white blood count was 14,300.  She was taken to the operating room on the same day by Dr. Magnus Ivan where she underwent a laparoscopic appendectomy.  She has had an uneventful postop course.  Her hemoglobin this morning is 11.  Her white blood count is 9600.  She ate breakfast and is ready for discharge.  Her discharge instructions include that she can return to work as tolerated.   She should not drive for 2-3 days.   She should eat light for a day or two.   She does not need any antibiotics.  She can start showering tomorrow.  She should see Dr. Magnus Ivan back in 2-3 weeks.  She will need to call for that appointment.  She is given Vicodin for pain.  Her final pathology is pending.  Her discharge condition is good.  Sandria Bales. Ezzard Standing, M.D., FACS   DHN/MEDQ  D:  08/02/2010  T:  08/02/2010  Job:  [id number]  Electronically Signed by Ovidio Kin M.D. on 08/05/2010 07:52:30 AM

## 2010-08-21 NOTE — Op Note (Signed)
NAMEABIAGEAL, BLOWE            ACCOUNT NO.:  [id number]  MEDICAL RECORD NO.:  [id number]           PATIENT TYPE:  O  LOCATION:  5153                         FACILITY:  [facility]  PHYSICIAN:  Abigail Miyamoto, M.D. DATE OF BIRTH:  April 02, 1960  DATE OF PROCEDURE:  08/01/2010 DATE OF DISCHARGE:                              OPERATIVE REPORT   PREOPERATIVE DIAGNOSIS:  Acute appendicitis.  POSTOPERATIVE DIAGNOSIS:  Acute appendicitis.  PROCEDURE:  Laparoscopic appendectomy.  SURGEON:  Abigail Miyamoto, M.D.  ANESTHESIA:  General endotracheal anesthesia and 0.5% Marcaine.  ESTIMATED BLOOD LOSS:  Minimal.  INDICATIONS:  Sonya Hogan is a 50 year old female who presents with right lower quadrant abdominal pain.  She was found on CAT scan to have acute appendicitis.  Therefore, a decision was made to proceed to the operating room.  FINDINGS:  The patient was found to have acute appendicitis without evidence of perforation.  PROCEDURE IN DETAIL:  The patient was brought to the operating room, identified as [name].  She was placed on the operative room table and general anesthesia was induced.  Her abdomen was then prepped and draped in the usual sterile fashion.  Using #15 blade, a small vertical incision was made through a previous scar just below the umbilicus.  This was carried down the fascia which was then opened with a scalpel.  Hemostat was used to pass the peritoneal cavity under direct vision.  Next, a 0 Vicryl pursestring suture was placed around the fascial opening.  The Hasson port was placed at the opening and insufflation of the abdomen was begun.  The patient had a moderate amount of adhesions of omentum to the lower midline.  I was able to brush them easily out of the way and was able to put a 5-mm port in the patient's lower abdomen.  I was also able to place another 5-mm port in the patient's right upper quadrant.  The cecum was identified  and elevated.  The appendix itself was coming off the base of cecum and a curling underneath the cecum and the small bowel.  I had to transect the base of the appendix first with endoscopic GIA stapler.  I then had to slowly dissect the appendix free from the retrocecal location with the Harmonic scalpel in blunt dissection.  This was difficult secondary to the small bowel lying on top of it, but I could finally free up the remaining appendix in its tip and remove it completely, take down the mesoappendix with the Harmonic scalpel.  The appendix was acutely inflamed without evidence of perforation.  Once the appendix was removed, I placed in an endosac and removed it through the incision at the umbilicus.  I then thoroughly irrigated the right lower quadrant with a liter of normal saline.  I evaluated the mesentery and appendiceal stump and no evidence of bleeding or injury was identified. Hemostasis appeared to be achieved.  At this point, all ports were removed under direct vision.  The abdomen was deflated.  The 0 Vicryl at the umbilicus was tied in place closing the fascial defect.  All incisions were anesthetized with Marcaine and closed with 4-0  Monocryl subcuticular sutures. Steri-Strips and Band-Aids were then applied.  The patient tolerated the procedure well.  All counts were correct at the end of procedure.  The patient was then extubated in operating room and taken in stable condition to recovery room.     Abigail Miyamoto, M.D.     DB/MEDQ  D:  08/01/2010  T:  08/02/2010  Job:  [id number]  Electronically Signed by Abigail Miyamoto M.D. on 08/21/2010 07:05:13 AM

## 2010-08-21 NOTE — H&P (Signed)
Sonya Hogan, Sonya Hogan            ACCOUNT NO.:  [id number]  MEDICAL RECORD NO.:  [id number]           PATIENT TYPE:  O  LOCATION:  5153                         FACILITY:  [facility]  PHYSICIAN:  Abigail Miyamoto, M.D. DATE OF BIRTH:  Apr 04, 1960  DATE OF ADMISSION:  08/01/2010 DATE OF DISCHARGE:                             HISTORY & PHYSICAL   CHIEF COMPLAINTS:  Right lower quadrant abdominal pain.  HISTORY:  This is a 50 year old female who presents to the emergency department with right lower quadrant abdominal pain.  She reports she woke up suddenly at 2 o'clock this morning with the pain.  It may have been more vague, but throughout the days it has become worse in the right lower quadrant.  She has had nausea with this, but no vomiting. Her bowel movements have been normal.  The pain is moderate to severe. It does not refer anywhere else.  PAST MEDICAL HISTORY AND SURGICAL HISTORY:  Significant for having had an abdominal hysterectomy and right salpingo-oophorectomy in 2005. Later in 2005, she had again a laparotomy with lysis of adhesions in the left and salpingo-oophorectomy and in 2007 she had a laparoscopic cholecystectomy.  History of kidney stones.  MEDICATIONS:  None.  ALLERGIES:  PENICILLIN.  SOCIAL HISTORY:  She does smoke cigarettes.  She drinks alcohol rarely.  FAMILY HISTORY:  Positive for colon and testicular cancer.  REVIEW OF SYSTEMS:  GENERAL:  Negative fevers or chills.  PULMONARY: Negative for cough, shortness of breath, or difficulty breathing. CARDIAC:  Negative for chest pain or irregular heartbeat.  ABDOMEN: Listed as above.  Again, there has been no emesis and bowel movements are normal.  URINARY:  Negative for dysuria or hematuria.  The rest of the review of systems including skin, eyes, ears, nose and throat, musculoskeletal, neurologic, psychiatric, and endocrine are normal.  PHYSICAL EXAMINATION:  GENERAL:  This is a thin female, lying in bed,  in mild discomfort. VITAL SIGNS:  Temperature 98.3, pulse 83, blood pressure 135/88, and respirations 20. EYES:  Anicteric.  Pupils are reactive bilaterally. ENT:  External ears and nose are normal.  Hearing is normal.  Oropharynx is clear. NECK:  Supple.  Trachea is no thyromegaly. LUNGS:  Clear to auscultation bilaterally with normal respiratory effort. CARDIOVASCULAR:  Regular rate and rhythm.  There are no murmurs.  There is no peripheral edema. ABDOMEN:  Soft.  She has multiple well-healed incisions.  She is tender with guarding in the right lower quadrant.  The rest of the abdomen is soft and nontender.  There are no masses.  There are no hernias.  There is no organomegaly. EXTREMITIES:  Warm and well perfused.  No edema, clubbing, or cyanosis. Peripheral pulses are intact to all 4 extremities. MUSCULOSKELETAL:  Normal strength and motor function in all 4 extremities. NEUROLOGIC:  Normal motor and sensory function in all 4 extremities. PSYCHIATRIC:  She is awake, alert, and oriented.  Judgment and affect appear normal. SKIN:  No rashes, no jaundice.  DATA REVIEWED:  The patient has elevated white blood count of 13,000, hemoglobin is 14.8, platelets are 221.  Creatinine 0.68.  The patient has a CAT scan  of the abdomen and pelvis which shows her to have a dilated appendix with periappendiceal stranding.  There is no free fluid.  IMPRESSION:  This is a patient with acute appendicitis.  PLAN:  At this point, we will proceed to the operating room for laparoscopic appendectomy.  I have discussed this with the patient and her husband in detail.  I discussed the risks of surgery which include but not limited to bleeding, infection, injury to surrounding structures including the ureter, need for conversion to an open procedure, appendiceal stump leak, further abscess formation, need for further surgery, etc.  She understands and wishes to proceed.  Surgery will thus be  scheduled.     Abigail Miyamoto, M.D.     DB/MEDQ  D:  08/01/2010  T:  08/02/2010  Job:  [id number]  Electronically Signed by Abigail Miyamoto M.D. on 08/21/2010 07:05:11 AM

## 2010-09-09 ENCOUNTER — Encounter (INDEPENDENT_AMBULATORY_CARE_PROVIDER_SITE_OTHER): Payer: Self-pay | Admitting: Surgery

## 2010-09-16 ENCOUNTER — Encounter (INDEPENDENT_AMBULATORY_CARE_PROVIDER_SITE_OTHER): Payer: BC Managed Care – PPO | Admitting: Surgery

## 2011-02-04 ENCOUNTER — Encounter: Payer: Self-pay | Admitting: Family Medicine

## 2011-02-04 ENCOUNTER — Telehealth: Payer: Self-pay | Admitting: Family Medicine

## 2011-02-04 ENCOUNTER — Ambulatory Visit (INDEPENDENT_AMBULATORY_CARE_PROVIDER_SITE_OTHER): Payer: BC Managed Care – PPO | Admitting: Family Medicine

## 2011-02-04 VITALS — BP 126/78 | HR 64 | Temp 98.1°F | Wt 111.0 lb

## 2011-02-04 DIAGNOSIS — G43909 Migraine, unspecified, not intractable, without status migrainosus: Secondary | ICD-10-CM

## 2011-02-04 DIAGNOSIS — R42 Dizziness and giddiness: Secondary | ICD-10-CM

## 2011-02-04 DIAGNOSIS — H8110 Benign paroxysmal vertigo, unspecified ear: Secondary | ICD-10-CM

## 2011-02-04 DIAGNOSIS — R51 Headache: Secondary | ICD-10-CM

## 2011-02-04 MED ORDER — MECLIZINE HCL 25 MG PO TABS: 25.0000 mg | ORAL_TABLET | Freq: Three times a day (TID) | ORAL | Status: DC | PRN

## 2011-02-04 MED ORDER — CYCLOBENZAPRINE HCL 10 MG PO TABS: 10.0000 mg | ORAL_TABLET | Freq: Three times a day (TID) | ORAL | Status: AC | PRN

## 2011-02-04 MED ORDER — KETOROLAC TROMETHAMINE 60 MG/2ML IM SOLN
30.0000 mg | Freq: Once | INTRAMUSCULAR | Status: AC
  Administered 2011-02-04: 30 mg via INTRAMUSCULAR

## 2011-02-04 NOTE — Telephone Encounter (Signed)
Patient notified and will drink more. She agreed this may have been a cause. She will update if no better/improvement.

## 2011-02-04 NOTE — Assessment & Plan Note (Signed)
Anticipate BPPV although dix hallpike not clearcut positive. Treat with meclizine and sent home with epley canal repositioning maneuvers. Update [place] if sxs not improving as expected.

## 2011-02-04 NOTE — Patient Instructions (Signed)
This could be BPPV or vertigo.  Treat with meclizine as needed for vertigo. Sent home with maneuvers to help as well. If dizziness not getting better, please let me know. For migraine, may take flexeril when having migraine - may make you sleepy. I do want you to back off ibuprofens as too much of this can actually cause a headache. Shot of toradol today.

## 2011-02-04 NOTE — Telephone Encounter (Signed)
Please notify blood pressure showed orthostatics today - i did not see this during OV. I do want her to ensure staying well hydrated - increase water to 8 8oz glasses per day or may do gatorade gingerale etc - as this can cause dizziness and worsening headaches. Let us know if dizziness not improved after meclizine, rehydration.

## 2011-02-04 NOTE — Progress Notes (Signed)
  Subjective:    Patient ID: Sonya Hogan, female    DOB: Aug 27, 1960, 50 y.o.   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  Notices room spinning with position changes in bed.  No falls.  This feels like prior dx of BPPV in 2010, however meclizine at home expired.  Has also had migraine since Thursday (for 5 days).  Spent thanksgiving in bed.  Pain dull throb throughout entire back of head bilateral.  + photophonophobia, nausea.  Has been taking 4 ibuprofen 200mg  3-4 times daily.  Also tried excedrin migraine.  Previously suffered from bad migraines, not recently.  Has tried imitrex shots in past, didn't really help.  Has been seen by headache center in past.  Does not want to return.  Smoking 1/2 ppd.  Has tried chantix, made her too nauseated.  No fevers/chills, SOB, chest pain/tightness, LOC.  Review of Systems Per HPI    Objective:   Physical Exam  Nursing note and vitals reviewed. Constitutional: She is oriented to person, place, and time. She appears well-developed and well-nourished. No distress.  HENT:  Head: Normocephalic and atraumatic.  Right Ear: Hearing, tympanic membrane, external ear and ear canal normal.  Left Ear: Hearing, tympanic membrane, external ear and ear canal normal.  Nose: Nose normal.  Mouth/Throat: Uvula is midline, oropharynx is clear and moist and mucous membranes are normal. No oropharyngeal exudate, posterior oropharyngeal edema, posterior oropharyngeal erythema or tonsillar abscesses.  Eyes: Conjunctivae and EOM are normal. Pupils are equal, round, and reactive to light. No scleral icterus.  Neck: Normal range of motion. Neck supple.  Cardiovascular: Normal rate, regular rhythm, normal heart sounds and intact distal pulses.   No murmur heard. Pulmonary/Chest: Effort  normal and breath sounds normal. No respiratory distress. She has no wheezes. She has no rales.  Musculoskeletal: Normal range of motion.  Lymphadenopathy:    She has no cervical adenopathy.  Neurological: She is alert and oriented to person, place, and time. A cranial nerve deficit is present. She displays a negative Romberg sign.       Left facial weakness EOMI without nystagmus dix hallpike minimally positive on right Neg pronator drift. Normal FTN  Skin: Skin is warm and dry. No rash noted.  Psychiatric: She has a normal mood and affect.      Assessment & Plan:

## 2011-02-04 NOTE — Assessment & Plan Note (Signed)
Consistent with common migraine. Treat with shot of toradol today, will also send in flexeril for abortive therapy. Advised to back off analgesics, discussed MOH. Update [place] if not improving.

## 2011-03-10 DIAGNOSIS — J449 Chronic obstructive pulmonary disease, unspecified: Secondary | ICD-10-CM

## 2011-03-10 HISTORY — DX: Chronic obstructive pulmonary disease, unspecified: J44.9

## 2011-06-23 ENCOUNTER — Ambulatory Visit (INDEPENDENT_AMBULATORY_CARE_PROVIDER_SITE_OTHER): Payer: BC Managed Care – PPO | Admitting: Family Medicine

## 2011-06-23 ENCOUNTER — Ambulatory Visit (INDEPENDENT_AMBULATORY_CARE_PROVIDER_SITE_OTHER)
Admission: RE | Admit: 2011-06-23 | Discharge: 2011-06-23 | Disposition: A | Discharge status: Home / Self Care | Payer: BC Managed Care – PPO | Source: Ambulatory Visit | Attending: Family Medicine | Admitting: Family Medicine

## 2011-06-23 ENCOUNTER — Encounter: Payer: Self-pay | Admitting: Family Medicine

## 2011-06-23 VITALS — BP 116/76 | HR 84 | Temp 98.2°F | Wt 111.2 lb

## 2011-06-23 DIAGNOSIS — M545 Low back pain: Secondary | ICD-10-CM

## 2011-06-23 DIAGNOSIS — R109 Unspecified abdominal pain: Secondary | ICD-10-CM

## 2011-06-23 LAB — BASIC METABOLIC PANEL
Chloride: 107 mEq/L (ref 96–112)
Creatinine, Ser: 0.8 mg/dL (ref 0.4–1.2)
GFR: 87.54 mL/min (ref >60.00)
Potassium: 4.6 mEq/L (ref 3.5–5.1)

## 2011-06-23 LAB — CBC WITH DIFFERENTIAL/PLATELET
Basophils Relative: 0.7 % (ref 0.0–3.0)
Eosinophils Relative: 0.7 % (ref 0.0–5.0)
MCV: 94.9 fl (ref 78.0–100.0)
Monocytes Absolute: 0.6 10*3/uL (ref 0.1–1.0)
Monocytes Relative: 6.3 % (ref 3.0–12.0)
Neutrophils Relative %: 63 % (ref 43.0–77.0)
RBC: 4.8 Mil/uL (ref 3.87–5.11)
WBC: 9.6 10*3/uL (ref 4.5–10.5)

## 2011-06-23 LAB — POCT URINALYSIS DIPSTICK
Bilirubin, UA: NEGATIVE
Glucose, UA: NEGATIVE
Leukocytes, UA: NEGATIVE
Nitrite, UA: NEGATIVE
Urobilinogen, UA: 0.2
pH, UA: 7

## 2011-06-23 MED ORDER — HYDROCODONE-ACETAMINOPHEN 5-500 MG PO TABS: 1.0000 | ORAL_TABLET | Freq: Three times a day (TID) | ORAL | Status: AC | PRN

## 2011-06-23 MED ORDER — CYCLOBENZAPRINE HCL 10 MG PO TABS: 10.0000 mg | ORAL_TABLET | Freq: Two times a day (BID) | ORAL | Status: AC | PRN

## 2011-06-23 NOTE — Patient Instructions (Signed)
Strain urine. Push fluids. Use ibuprofen regularly for next several days and then vicodin for breakthrough pain Use flexeril as needed as muscle relaxant (may make you sleepy) Pass by [facility]'s office for CT scan.

## 2011-06-23 NOTE — Progress Notes (Signed)
  Subjective:    Patient ID: Sonya Hogan, female    DOB: 10-28-1960, 51 y.o.   MRN: [id number]  HPI CC: LBP  5 d h/o pain R flank region, not so much midline.  Worst pain last 2 nights.  Starts flank region, sharp pain along band across mid back.  Currently dull pain but not unbearable.  Gets worse as day progresses and worse when laying on back.  At its worse 8/10, currently 1-2/10.  Positional.  Has used vicodin and 4 advil which helped.  Heating pad didn't really help.  Reminded her of ovarian torsion pain.  No fevers/chills, nausea/vomiting, abd pain, dysuria, urgency, frequency or blood in urine.  No BM changes.  No gassiness.  No shooting pain down legs.  Has been working in yard recently.  But denies falls, injury to back, or heavy lifting.  Known large stones in kidneys (found about 7 yrs ago)  S/p cholecystectomy and appendectomy and hysterectomy with bilateral oophorectomy.  Past Medical History  Diagnosis Date  . Migraines   . OP (osteoporosis)   . Bell's palsy 1998  . Nephrolithiasis   . Cholelithiases   . History of anemia     resolved as of 2011  . Ovarian torsion ~2005   Past Surgical History  Procedure Date  . Total abdominal hysterectomy w/ bilateral salpingoophorectomy     for ovarian torsion  . Cholecystectomy   . Cesarean section     x 2  . Laparoscopic appendectomy 07/2010  . Laparoscopy     lysis of adhesions  . Appendectomy     Review of Systems Per HPI    Objective:   Physical Exam  Nursing note and vitals reviewed. Constitutional: She appears well-developed and well-nourished. No distress.  HENT:  Head: Normocephalic and atraumatic.  Mouth/Throat: Oropharynx is clear and moist. No oropharyngeal exudate.  Eyes: Conjunctivae and EOM are normal. Pupils are equal, round, and reactive to light.  Neck: Normal range of motion. Neck supple.  Cardiovascular: Normal rate, regular rhythm, normal heart sounds and intact distal pulses.   No  murmur heard. Pulmonary/Chest: Effort normal and breath sounds normal. No respiratory distress. She has no wheezes. She has no rales.  Abdominal: Soft. Bowel sounds are normal. She exhibits no distension and no mass. There is no hepatosplenomegaly. There is no tenderness. There is no rebound, no guarding and no CVA tenderness. No hernia.  Musculoskeletal: She exhibits no edema.       No midline spine or paraspinous mm tenderness. Tender to palpation (mild soreness) at right flank region and some medially.  Skin: Skin is warm and dry. No rash noted.  Psychiatric: She has a normal mood and affect.       Assessment & Plan:

## 2011-06-23 NOTE — Assessment & Plan Note (Signed)
5d h/o R flank pain, not improving.  Worse at night.  Anticipate more MSK issue (muscle strain) however known nephrolithiasis by last CT 2005 as well as possible chronic right hydroureter on last few imaging. Will rpt CT scan abd/pelvis to eval for stone. Treat as per instructions. Will call with CT results.

## 2011-06-23 NOTE — Progress Notes (Signed)
Addended by: Alvina Chou on: 06/23/2011 10:23 AM   Modules accepted: Orders

## 2011-11-26 ENCOUNTER — Encounter: Payer: Self-pay | Admitting: Family Medicine

## 2011-11-26 ENCOUNTER — Ambulatory Visit (INDEPENDENT_AMBULATORY_CARE_PROVIDER_SITE_OTHER)
Admission: RE | Admit: 2011-11-26 | Discharge: 2011-11-26 | Disposition: A | Discharge status: Home / Self Care | Payer: BC Managed Care – PPO | Source: Ambulatory Visit | Attending: Family Medicine | Admitting: Family Medicine

## 2011-11-26 ENCOUNTER — Telehealth: Payer: Self-pay

## 2011-11-26 ENCOUNTER — Ambulatory Visit (INDEPENDENT_AMBULATORY_CARE_PROVIDER_SITE_OTHER): Payer: BC Managed Care – PPO | Admitting: Family Medicine

## 2011-11-26 VITALS — BP 124/72 | HR 68 | Temp 98.2°F | Ht 63.75 in | Wt 109.0 lb

## 2011-11-26 DIAGNOSIS — Z23 Encounter for immunization: Secondary | ICD-10-CM

## 2011-11-26 DIAGNOSIS — R1013 Epigastric pain: Secondary | ICD-10-CM | POA: Insufficient documentation

## 2011-11-26 DIAGNOSIS — R079 Chest pain, unspecified: Secondary | ICD-10-CM

## 2011-11-26 LAB — COMPREHENSIVE METABOLIC PANEL
Albumin: 4.5 g/dL (ref 3.5–5.2)
BUN: 10 mg/dL (ref 6–23)
CO2: 28 mEq/L (ref 19–32)
Calcium: 9.7 mg/dL (ref 8.4–10.5)
Chloride: 105 mEq/L (ref 96–112)
GFR: 86.05 mL/min (ref >60.00)
Glucose, Bld: 83 mg/dL (ref 70–99)
Potassium: 4.8 mEq/L (ref 3.5–5.1)
Total Protein: 7.3 g/dL (ref 6.0–8.3)

## 2011-11-26 LAB — CBC WITH DIFFERENTIAL/PLATELET
Basophils Relative: 0.8 % (ref 0.0–3.0)
Eosinophils Relative: 1.5 % (ref 0.0–5.0)
Lymphocytes Relative: 34.3 % (ref 12.0–46.0)
Neutrophils Relative %: 56.2 % (ref 43.0–77.0)
Platelets: 219 10*3/uL (ref 150.0–400.0)
RBC: 4.85 Mil/uL (ref 3.87–5.11)
WBC: 9.2 10*3/uL (ref 4.5–10.5)

## 2011-11-26 LAB — TROPONIN I: Troponin I: <0.3 ng/mL (ref <0.30)

## 2011-11-26 LAB — TSH: TSH: 0.62 u[IU]/mL (ref 0.35–5.50)

## 2011-11-26 MED ORDER — OMEPRAZOLE 40 MG PO CPDR: 40.0000 mg | DELAYED_RELEASE_CAPSULE | Freq: Every day | ORAL | Status: DC

## 2011-11-26 MED ORDER — MECLIZINE HCL 25 MG PO TABS: 25.0000 mg | ORAL_TABLET | Freq: Three times a day (TID) | ORAL | Status: AC | PRN

## 2011-11-26 NOTE — Progress Notes (Signed)
  Subjective:    Patient ID: Sonya Hogan, female    DOB: April 03, 1960, 51 y.o.   MRN: [id number]  HPI CC: chest discomfort   2 wk h/o uncomfortable feeling mid chest described as cinder block on chest lower substernally, as well as occasional pain between shoulders.  Getting worse.  Has had to go home from work due to discomfort.  Uncomfortable laying down, stays present.  Stays some nauseated.  More noticeable as day progresses.    hasn't exercised recently but not obviously worse with exertion.  Not relieved by rest. So far tried advil, didn't help. Not reproducible. Doing well at work and home, denies increased stress.  Daughter recently married.  Planning vacation soon.  Eating bland foods but not improving.  Hasn't noticed worse with certain foods.  Not associated with fevers/chills, shortness of breath, coughing, palpitations, HA.  No vomiting, diarrhea/constipation, heartburn, gassiness, bloating.  No dysphagia.  No early satiety. Denies weight loss. Wt Readings from Last 3 Encounters:  11/26/11 109 lb (49.442 kg)  06/23/11 111 lb 4 oz (50.463 kg)  02/04/11 111 lb (50.349 kg)   + significant NSAID use. No significant EtOH use. Smoker - 1/3 ppd or less.  chantix caused nausea. No early fmhx CAD.  Past Medical History  Diagnosis Date  . Migraines   . OP (osteoporosis)   . Bell's palsy 1998  . Nephrolithiasis   . Cholelithiases   . History of anemia     resolved as of 2011  . Ovarian torsion ~2005    Family History  Problem Relation Age of Onset  . Cancer Brother     testicular  . Cancer Paternal Grandmother     colon  . Cancer Paternal Aunt     breast  . Cancer Paternal Uncle     lung (nonsmoker)  . Coronary artery disease Maternal Grandmother   . Stroke Mother 13  . Diabetes Mother     Review of Systems Per HPI    Objective:   Physical Exam  Nursing note and vitals reviewed. Constitutional: She appears well-developed and well-nourished. No  distress.  HENT:  Head: Normocephalic and atraumatic.  Mouth/Throat: Oropharynx is clear and moist. No oropharyngeal exudate.  Eyes: Conjunctivae normal and EOM are normal. Pupils are equal, round, and reactive to light. No scleral icterus.  Neck: Normal range of motion. Neck supple.  Cardiovascular: Normal rate, regular rhythm, normal heart sounds and intact distal pulses.   No murmur heard. Pulmonary/Chest: Effort normal. No respiratory distress. She has no wheezes. She has no rales. She exhibits tenderness (somewhat reproducible with palpation of lower sternum).  Abdominal: Soft. Bowel sounds are normal. She exhibits no distension and no mass. There is no hepatosplenomegaly. There is tenderness (mild pain to deep palpation - reproduces pain) in the epigastric area. There is no rebound, no guarding and no CVA tenderness.  Musculoskeletal: She exhibits no edema.  Lymphadenopathy:    She has no cervical adenopathy.  Skin: Skin is warm and dry. No rash noted.       Assessment & Plan:

## 2011-11-26 NOTE — Telephone Encounter (Signed)
Sonya Hogan with [facility] called stat report for Troponin I  -  0.30 .   Sonya Hogan said this is normal.

## 2011-11-26 NOTE — Assessment & Plan Note (Addendum)
Atypical chest pain, but does have risk factors - smoker. Anticipate more GI issue today - see above. EKG - NSR rate 60, normal axis, intervals, no acute ST/T changes.  ?q waves inferior leads Check TnI today. If not improving on PPI, will refer to cardiology for further evaluation and risk stratification with stress test. No results found for this basename: CHOL, HDL, LDLCALC, LDLDIRECT, TRIG, CHOLHDL  CXR today in 2 wk h/o chest discomfort, smoker, fmhx lung cancer - no acute changes on my read but significant hyperexpansion - discussed this with pt, encouraged continued smoking cessation.

## 2011-11-26 NOTE — Patient Instructions (Addendum)
I wonder if this is coming from dyspepsia, or stomach discomfort. Treat with omeprazole daily for next few weeks (samples provided). If not improving, let us know. EKG overall stable, but question of old damage to heart - if not improving as expected, we will refer you to heart doctors for further evaluation. Blood work today, chest xray today. Keep working on cutting back on smoking. Call us with questions.

## 2011-11-26 NOTE — Assessment & Plan Note (Addendum)
Anticipate more gastritis - treat with omeprazole daily for next 3 wks, then prn. See pt instructions. Discussed avoiding NSAIDs.

## 2011-11-26 NOTE — Telephone Encounter (Signed)
Noted. Thanks.

## 2012-03-15 ENCOUNTER — Telehealth: Payer: Self-pay

## 2012-03-15 DIAGNOSIS — R079 Chest pain, unspecified: Secondary | ICD-10-CM

## 2012-03-15 NOTE — Telephone Encounter (Signed)
Placed referral  

## 2012-03-15 NOTE — Telephone Encounter (Signed)
Pt seen 11/26/11 and was told if symptoms continue to call back for cardiology referral. Pt said on and off while resting feels like heart is racing and beating hard with SOB; pt continues to have problems with dizziness and nausea; (Prilosec helps nausea somewhat). Pt is still smoking. Not having a problem now but thinks needs referral to get symptoms checked out. Advised pt if condition changes or worsens pt to call back or go to UC.Please advise.

## 2012-03-16 NOTE — Telephone Encounter (Signed)
Appt made with [facility] and patient aware.

## 2012-03-16 NOTE — Telephone Encounter (Signed)
Patient notified and will await call from [facility]. 

## 2012-03-29 ENCOUNTER — Encounter: Payer: Self-pay | Admitting: Cardiovascular Disease

## 2012-03-29 ENCOUNTER — Ambulatory Visit (INDEPENDENT_AMBULATORY_CARE_PROVIDER_SITE_OTHER): Payer: BC Managed Care – PPO | Admitting: Cardiovascular Disease

## 2012-03-29 VITALS — BP 129/76 | HR 64 | Ht 63.3 in | Wt 110.8 lb

## 2012-03-29 DIAGNOSIS — R002 Palpitations: Secondary | ICD-10-CM

## 2012-03-29 DIAGNOSIS — R079 Chest pain, unspecified: Secondary | ICD-10-CM

## 2012-03-29 NOTE — Patient Instructions (Addendum)
Your physician has recommended that you wear a 24 hour holter monitor. Holter monitors are medical devices that record the heart's electrical activity. Doctors most often use these monitors to diagnose arrhythmias. Arrhythmias are problems with the speed or rhythm of the heartbeat. The monitor is a small, portable device. You can wear one while you do your normal daily activities. This is usually used to diagnose what is causing palpitations/syncope (passing out).  Your physician has requested that you have a stress echocardiogram. For further information please visit [email or web address] Please follow instruction sheet as given.  Your physician recommends that you schedule a follow-up appointment as needed.   Your physician recommends that you continue on your current medications as directed. Please refer to the Current Medication list given to you today.

## 2012-03-29 NOTE — Progress Notes (Signed)
HPI:  52 year old woman presenting for initial cardiac evaluation. She was seen in September 2013 by her primary care physician and was evaluated for atypical chest pain. Her troponin and EKGs were normal at that time.  The patient reports a six-month history of palpitations, primarily at rest. She feels heart racing and irregular heartbeats. She experiences this nearly every day, several times per day. They occur at rest and with activity, but are more noticeable with rest. She also describes a six-month history of exertional chest tightness and shortness of breath. This is not progressing but has been present now for several months. She denies resting chest pain or tightness. She denies syncope, but does experience lightheadedness. This is worse when her eyes are closed and she attributes this to vertigo. There is some improvement with meclizine. She does not have positional lightheadedness. She complains of shortness of breath with exertion. She denies orthopnea, PND, cough, or leg swelling.  The patient drinks about 4 cups of coffee per day. She's been smoking cigarettes for almost 30 years. She drinks 4 glasses of water per day on average. She does not add salt to her food.  There is no family history of premature coronary artery disease. Her maternal grandmother had a myocardial infarction at age 57. The patient has no personal history of hypertension, dyslipidemia, or diabetes.  Outpatient Encounter Prescriptions as of 03/29/2012  Medication Sig Dispense Refill  . Calcium Carbonate-Vitamin D (CALCIUM 600+D) 600-400 MG-UNIT per tablet Take 1 tablet by mouth 2 (two) times daily.        Marland Kitchen ibuprofen (ADVIL,MOTRIN) 200 MG tablet Take 200 mg by mouth every 6 (six) hours as needed.        . meclizine (ANTIVERT) 25 MG tablet Take 1 tablet (25 mg total) by mouth 3 (three) times daily as needed for dizziness or nausea.  30 tablet  0  . Multiple Vitamin (MULTIVITAMIN) tablet Take 1 tablet by mouth daily.         Marland Kitchen omeprazole (PRILOSEC) 40 MG capsule Take 1 capsule (40 mg total) by mouth daily. For 3 weeks then as needed  30 capsule  3    Penicillins  Past Medical History  Diagnosis Date  . Migraines   . OP (osteoporosis)   . Bell's palsy 1998  . Nephrolithiasis   . Cholelithiases   . History of anemia     resolved as of 2011  . Ovarian torsion ~2005  . COPD (chronic obstructive pulmonary disease) 2013    by CXR (hyperinflation)    Past Surgical History  Procedure Date  . Total abdominal hysterectomy w/ bilateral salpingoophorectomy     for ovarian torsion  . Cholecystectomy   . Cesarean section     x 2  . Laparoscopic appendectomy 07/2010  . Laparoscopy     lysis of adhesions  . Appendectomy     History   Social History  . Marital Status: Married    Spouse Name: N/A    Number of Children: 2  . Years of Education: N/A   Occupational History  . Business management at [employer]    Social History Main Topics  . Smoking status: Current Every Day Smoker -- 0.3 packs/day    Types: Cigarettes  . Smokeless tobacco: Not on file  . Alcohol Use: Yes     Comment: Occasional wine  . Drug Use: No  . Sexually Active: Not on file   Other Topics Concern  . Not on file   Social  History Narrative  . No narrative on file    Family History  Problem Relation Age of Onset  . Cancer Brother     testicular  . Cancer Paternal Grandmother     colon  . Cancer Paternal Aunt     breast  . Cancer Paternal Uncle     lung (nonsmoker)  . Coronary artery disease Maternal Grandmother   . Stroke Mother 20  . Diabetes Mother     ROS:  General: no fevers/chills/night sweats Eyes: no blurry vision, diplopia, or amaurosis ENT: no sore throat or hearing loss, positive for vertigo Resp: no cough, wheezing, or hemoptysis CV: no edema or palpitations GI: no abdominal pain, nausea, vomiting, diarrhea, or constipation GU: no dysuria, frequency, or hematuria Skin: no rash Neuro:  no numbness, tingling, or weakness of extremities. Positive for headaches Musculoskeletal: no joint pain or swelling Heme: no bleeding, DVT, or easy bruising Endo: no polydipsia or polyuria  BP 129/76  Pulse 64  Ht 5' 3.3" (1.608 m)  Wt 50.259 kg (110 lb 12.8 oz)  BMI 19.44 kg/m2  PHYSICAL EXAM: Pt is alert and oriented, WD, WN, thin woman in no distress. HEENT: normal Neck: JVP normal. Carotid upstrokes normal without bruits. No thyromegaly. Lungs: equal expansion, clear bilaterally CV: Apex is discrete and nondisplaced, RRR without murmur or gallop Abd: soft, NT, +BS, no bruit, no hepatosplenomegaly Back: no CVA tenderness Ext: no C/C/E        Femoral pulses 2+= without bruits        DP/PT pulses intact and = Skin: warm and dry without rash Neuro: CNII-XII intact             Strength intact = bilaterally  EKG:  Normal sinus rhythm 65 beats per minute, within normal limits there  ASSESSMENT AND PLAN: 1. Cardiac palpitations. Physical exam is essentially unremarkable with no obvious evidence of structural heart disease. Symptoms have now been present for 6 months and they are frequent. I have recommended a 24-hour Holter monitor for further evaluation. Have also recommended that she reduce caffeine intake and push non-caffeinated fluids. If Holter monitor demonstrates no significant arrhythmia, will plan on observation and reassurance.  2. Exertional chest tightness. I think this needs further evaluation in this patient with a long history of tobacco use. Recommend a stress echocardiogram to rule out ischemic heart disease.  3. Tobacco abuse. Tobacco cessation counseling was done.  For followup, I will see the patient back as needed unless her stress echocardiogram or Holter monitor demonstrates significant abnormalities. We discussed the importance of tobacco cessation and caffeine reduction as part of her treatment plan. I appreciate the opportunity to see this nice  lady.  Tonny Bollman 03/29/2012 9:52 AM

## 2012-04-05 ENCOUNTER — Telehealth: Payer: Self-pay | Admitting: *Deleted

## 2012-04-05 ENCOUNTER — Encounter: Payer: Self-pay | Admitting: Cardiovascular Disease

## 2012-04-05 ENCOUNTER — Ambulatory Visit (HOSPITAL_COMMUNITY): Payer: BC Managed Care – PPO | Attending: Internal Medicine

## 2012-04-05 ENCOUNTER — Encounter (INDEPENDENT_AMBULATORY_CARE_PROVIDER_SITE_OTHER): Payer: BC Managed Care – PPO

## 2012-04-05 DIAGNOSIS — R072 Precordial pain: Secondary | ICD-10-CM

## 2012-04-05 DIAGNOSIS — R0989 Other specified symptoms and signs involving the circulatory and respiratory systems: Secondary | ICD-10-CM

## 2012-04-05 DIAGNOSIS — R002 Palpitations: Secondary | ICD-10-CM | POA: Insufficient documentation

## 2012-04-05 DIAGNOSIS — F17200 Nicotine dependence, unspecified, uncomplicated: Secondary | ICD-10-CM | POA: Insufficient documentation

## 2012-04-05 DIAGNOSIS — J449 Chronic obstructive pulmonary disease, unspecified: Secondary | ICD-10-CM | POA: Insufficient documentation

## 2012-04-05 DIAGNOSIS — R079 Chest pain, unspecified: Secondary | ICD-10-CM

## 2012-04-05 DIAGNOSIS — R0609 Other forms of dyspnea: Secondary | ICD-10-CM | POA: Insufficient documentation

## 2012-04-05 DIAGNOSIS — J4489 Other specified chronic obstructive pulmonary disease: Secondary | ICD-10-CM | POA: Insufficient documentation

## 2012-04-05 NOTE — Progress Notes (Signed)
Echocardiogram performed.  

## 2012-04-05 NOTE — Telephone Encounter (Signed)
24 hr holter monitor placed 04/05/12 TK

## 2012-04-07 ENCOUNTER — Encounter: Payer: Self-pay | Admitting: Cardiovascular Disease

## 2012-04-07 NOTE — Telephone Encounter (Signed)
This encounter was created in error - please disregard.

## 2012-04-07 NOTE — Telephone Encounter (Signed)
Follow up call   Returning call back to nurse  

## 2013-01-12 ENCOUNTER — Other Ambulatory Visit: Payer: Self-pay

## 2013-03-03 ENCOUNTER — Encounter: Payer: Self-pay | Admitting: Family Medicine

## 2013-03-03 ENCOUNTER — Ambulatory Visit (INDEPENDENT_AMBULATORY_CARE_PROVIDER_SITE_OTHER): Payer: BC Managed Care – PPO | Admitting: Family Medicine

## 2013-03-03 VITALS — BP 106/68 | HR 71 | Temp 98.1°F | Wt 115.5 lb

## 2013-03-03 DIAGNOSIS — J019 Acute sinusitis, unspecified: Secondary | ICD-10-CM

## 2013-03-03 MED ORDER — AZITHROMYCIN 250 MG PO TABS: ORAL_TABLET | ORAL | Status: DC

## 2013-03-03 NOTE — Assessment & Plan Note (Signed)
Still ttp x4 but improved today.  Would continue as is for now as she is improved, hold abx for now.  Use if sx worsen again.  She agrees.  Nontoxic.  F/u prn.

## 2013-03-03 NOTE — Progress Notes (Signed)
Pre-visit discussion using our clinic review tool. No additional management support is needed unless otherwise documented below in the visit note.  Sx started about 1 week ago. Facial aching, pressure.  Progressive through the week.  Maxillary pain B.  Worse again yesterday.  No fevers.  No cough.  Taking mucinex and advil.  She is much better today.  No ear pain.  Mild ST.  No rash.    Smoking cessation encouraged.  She is considering this.  Intolerant of chantix prev.   Meds, vitals, and allergies reviewed.   ROS: See HPI.  Otherwise, noncontributory.  GEN: nad, alert and oriented HEENT: mucous membranes moist, tm w/o erythema, nasal exam w/o erythema, no discharge noted but small amount of crusted blood noted on medial side of L nostril,  OP with cobblestoning, sinuses ttp x4 NECK: supple w/o LA CV: rrr.   PULM: ctab, no inc wob EXT: no edema SKIN: no acute rash

## 2013-03-03 NOTE — Patient Instructions (Signed)
Drink plenty of fluids and start the antibiotics if you don't continue to improve.  This should gradually improve.  Take care.  Let us know if you have other concerns.

## 2013-12-22 ENCOUNTER — Other Ambulatory Visit: Payer: Self-pay

## 2015-08-24 ENCOUNTER — Encounter (HOSPITAL_COMMUNITY): Payer: Self-pay | Admitting: Family Medicine

## 2015-08-24 ENCOUNTER — Emergency Department (HOSPITAL_COMMUNITY)
Admission: EM | Admit: 2015-08-24 | Discharge: 2015-08-24 | Disposition: A | Discharge status: Home / Self Care | Payer: BLUE CROSS/BLUE SHIELD | Attending: Emergency Medicine | Admitting: Emergency Medicine

## 2015-08-24 ENCOUNTER — Emergency Department (HOSPITAL_COMMUNITY): Payer: BLUE CROSS/BLUE SHIELD

## 2015-08-24 ENCOUNTER — Other Ambulatory Visit: Payer: Self-pay

## 2015-08-24 DIAGNOSIS — J449 Chronic obstructive pulmonary disease, unspecified: Secondary | ICD-10-CM | POA: Insufficient documentation

## 2015-08-24 DIAGNOSIS — R1013 Epigastric pain: Secondary | ICD-10-CM | POA: Insufficient documentation

## 2015-08-24 DIAGNOSIS — R1012 Left upper quadrant pain: Secondary | ICD-10-CM | POA: Diagnosis present

## 2015-08-24 DIAGNOSIS — F17210 Nicotine dependence, cigarettes, uncomplicated: Secondary | ICD-10-CM | POA: Diagnosis not present

## 2015-08-24 DIAGNOSIS — R0781 Pleurodynia: Secondary | ICD-10-CM | POA: Insufficient documentation

## 2015-08-24 DIAGNOSIS — J441 Chronic obstructive pulmonary disease with (acute) exacerbation: Secondary | ICD-10-CM

## 2015-08-24 DIAGNOSIS — R911 Solitary pulmonary nodule: Secondary | ICD-10-CM

## 2015-08-24 LAB — COMPREHENSIVE METABOLIC PANEL
ALT: 16 U/L (ref 14–54)
AST: 16 U/L (ref 15–41)
Albumin: 4 g/dL (ref 3.5–5.0)
Alkaline Phosphatase: 76 U/L (ref 38–126)
Anion gap: 7 (ref 5–15)
BUN: 8 mg/dL (ref 6–20)
CHLORIDE: 109 mmol/L (ref 101–111)
CO2: 24 mmol/L (ref 22–32)
Calcium: 9.3 mg/dL (ref 8.9–10.3)
Creatinine, Ser: 0.66 mg/dL (ref 0.44–1.00)
Glucose, Bld: 91 mg/dL (ref 65–99)
POTASSIUM: 3.7 mmol/L (ref 3.5–5.1)
SODIUM: 140 mmol/L (ref 135–145)
Total Bilirubin: 0.5 mg/dL (ref 0.3–1.2)
Total Protein: 6.4 g/dL — ABNORMAL LOW (ref 6.5–8.1)

## 2015-08-24 LAB — URINALYSIS, ROUTINE W REFLEX MICROSCOPIC
Bilirubin Urine: NEGATIVE
GLUCOSE, UA: NEGATIVE mg/dL
HGB URINE DIPSTICK: NEGATIVE
Ketones, ur: NEGATIVE mg/dL
LEUKOCYTES UA: NEGATIVE
Nitrite: NEGATIVE
PH: 6 (ref 5.0–8.0)
Protein, ur: NEGATIVE mg/dL
Specific Gravity, Urine: 1.011 (ref 1.005–1.030)

## 2015-08-24 LAB — CBC
HEMATOCRIT: 44.2 % (ref 36.0–46.0)
Hemoglobin: 14.6 g/dL (ref 12.0–15.0)
MCH: 31 pg (ref 26.0–34.0)
MCHC: 33 g/dL (ref 30.0–36.0)
MCV: 93.8 fL (ref 78.0–100.0)
Platelets: 256 10*3/uL (ref 150–400)
RBC: 4.71 MIL/uL (ref 3.87–5.11)
RDW: 13.3 % (ref 11.5–15.5)
WBC: 8.5 10*3/uL (ref 4.0–10.5)

## 2015-08-24 LAB — LIPASE, BLOOD: LIPASE: 25 U/L (ref 11–51)

## 2015-08-24 MED ORDER — IOPAMIDOL (ISOVUE-370) INJECTION 76%
INTRAVENOUS | Status: AC
  Administered 2015-08-24: 100 mL
  Filled 2015-08-24: qty 100

## 2015-08-24 MED ORDER — AZITHROMYCIN 250 MG PO TABS: ORAL_TABLET | ORAL | Status: DC

## 2015-08-24 MED ORDER — FAMOTIDINE IN NACL 20-0.9 MG/50ML-% IV SOLN
20.0000 mg | Freq: Once | INTRAVENOUS | Status: AC
  Administered 2015-08-24: 20 mg via INTRAVENOUS
  Filled 2015-08-24: qty 50

## 2015-08-24 MED ORDER — ALBUTEROL SULFATE HFA 108 (90 BASE) MCG/ACT IN AERS: 1.0000 | INHALATION_SPRAY | Freq: Four times a day (QID) | RESPIRATORY_TRACT | Status: AC | PRN

## 2015-08-24 MED ORDER — PREDNISONE 20 MG PO TABS
60.0000 mg | ORAL_TABLET | Freq: Once | ORAL | Status: AC
  Administered 2015-08-24: 60 mg via ORAL
  Filled 2015-08-24: qty 3

## 2015-08-24 MED ORDER — GI COCKTAIL ~~LOC~~
30.0000 mL | Freq: Once | ORAL | Status: AC
  Administered 2015-08-24: 30 mL via ORAL
  Filled 2015-08-24: qty 30

## 2015-08-24 MED ORDER — PREDNISONE 20 MG PO TABS: ORAL_TABLET | ORAL | Status: DC

## 2015-08-24 MED ORDER — HYDROCODONE-ACETAMINOPHEN 5-325 MG PO TABS
1.0000 | ORAL_TABLET | ORAL | Status: DC | PRN
Start: 2015-08-24 — End: 2018-11-23

## 2015-08-24 MED ORDER — HYDROMORPHONE HCL 1 MG/ML IJ SOLN
1.0000 mg | Freq: Once | INTRAMUSCULAR | Status: AC
  Administered 2015-08-24: 1 mg via INTRAVENOUS
  Filled 2015-08-24: qty 1

## 2015-08-24 MED ORDER — SODIUM CHLORIDE 0.9 % IV BOLUS (SEPSIS)
1000.0000 mL | Freq: Once | INTRAVENOUS | Status: AC
  Administered 2015-08-24: 1000 mL via INTRAVENOUS

## 2015-08-24 MED ORDER — ONDANSETRON HCL 4 MG/2ML IJ SOLN
4.0000 mg | Freq: Once | INTRAMUSCULAR | Status: AC
  Administered 2015-08-24: 4 mg via INTRAVENOUS
  Filled 2015-08-24: qty 2

## 2015-08-24 NOTE — ED Provider Notes (Signed)
CSN: [id number]     Arrival date & time 08/24/15  1440 History   First MD Initiated Contact with Patient 08/24/15 1716     Chief Complaint  Patient presents with  . Abdominal Pain   HPI Comments: 55 year old female who presents with left upper quadrant pain for the past week. Past medical history significant for migraines, nephrolithiasis. She is s/p cholecystectomy, s/p appendectomy, s/p total hysterectomy. She states that the pain was a dull ache and not very intense however within the past day it has increased in intensity. Pain is constant, worse with movement, worse with inspiration. She reports a history of taking large quantities of ibuprofen due to her migraines. Denies fever, chills, nausea, vomiting, diarrhea, constipation, dysuria, blood in her urine, blood in her stool. Pain is not worse with food intake. Never had a colonoscopy or endoscopy.  Patient is a 55 y.o. female presenting with abdominal pain.  Abdominal Pain Associated symptoms: nausea   Associated symptoms: no constipation, no cough, no dysuria, no hematuria, no shortness of breath and no vomiting     Past Medical History  Diagnosis Date  . Migraines   . OP (osteoporosis)   . Bell's palsy 1998  . Nephrolithiasis   . Cholelithiases   . History of anemia     resolved as of 2011  . Ovarian torsion ~2005  . COPD (chronic obstructive pulmonary disease) ([facility]) 2013    by CXR (hyperinflation)   Past Surgical History  Procedure Laterality Date  . Total abdominal hysterectomy w/ bilateral salpingoophorectomy      for ovarian torsion  . Cholecystectomy    . Cesarean section      x 2  . Laparoscopic appendectomy  07/2010  . Laparoscopy      lysis of adhesions  . Appendectomy     Family History  Problem Relation Age of Onset  . Cancer Brother     testicular  . Cancer Paternal Grandmother     colon  . Cancer Paternal Aunt     breast  . Cancer Paternal Uncle     lung (nonsmoker)  . Coronary artery disease  Maternal Grandmother   . Stroke Mother 85  . Diabetes Mother    Social History  Substance Use Topics  . Smoking status: Current Every Day Smoker -- 0.30 packs/day    Types: Cigarettes  . Smokeless tobacco: None  . Alcohol Use: Yes     Comment: Occasional wine   OB History    No data available     Review of Systems  Respiratory: Negative for cough and shortness of breath.   Gastrointestinal: Positive for nausea and abdominal pain. Negative for vomiting, constipation and blood in stool.  Genitourinary: Negative for dysuria and hematuria.  All other systems reviewed and are negative.   Allergies  Chantix and Penicillins  Home Medications   Prior to Admission medications   Medication Sig Start Date End Date Taking? Authorizing Provider  azithromycin (ZITHROMAX) 250 MG tablet 2 tabs a day for 1 day and then 1 a day for 4 days. 03/03/13   Joaquim Nam, MD  Calcium Carbonate-Vitamin D (CALCIUM 600+D) 600-400 MG-UNIT per tablet Take 1 tablet by mouth 2 (two) times daily.      Historical Provider, MD  ibuprofen (ADVIL,MOTRIN) 200 MG tablet Take 200 mg by mouth every 6 (six) hours as needed.      Historical Provider, MD  meclizine (ANTIVERT) 25 MG tablet Take 25 mg by mouth 3 (three) times daily  as needed for dizziness.    Historical Provider, MD  Multiple Vitamin (MULTIVITAMIN) tablet Take 1 tablet by mouth daily.      Historical Provider, MD   BP 128/67 mmHg  Pulse 60  Temp(Src) 97.7 F (36.5 C) (Oral)  Resp 18  Ht 5' 3.75" (1.619 m)  Wt 50.009 kg  BMI 19.08 kg/m2  SpO2 97%   Physical Exam  Constitutional: She is oriented to person, place, and time. She appears well-developed and well-nourished. No distress.  HENT:  Head: Normocephalic and atraumatic.  Eyes: Conjunctivae are normal. Pupils are equal, round, and reactive to light. Right eye exhibits no discharge. Left eye exhibits no discharge. No scleral icterus.  Neck: Normal range of motion.  Cardiovascular: Normal  rate and regular rhythm.  Exam reveals no gallop.   No murmur heard. Pulmonary/Chest: Effort normal and breath sounds normal. No respiratory distress. She has no wheezes. She has no rales. She exhibits tenderness.  Left lower rib tenderness with palpation  Abdominal: Soft. Bowel sounds are normal. She exhibits no distension and no mass. There is tenderness. There is no rebound and no guarding.  LUQ tenderness and mild epigastric tenderness  Neurological: She is alert and oriented to person, place, and time.  Skin: Skin is warm and dry.  Psychiatric: She has a normal mood and affect. Her behavior is normal.    ED Course  Procedures (including critical care time) Labs Review Labs Reviewed  COMPREHENSIVE METABOLIC PANEL - Abnormal; Notable for the following:    Total Protein 6.4 (*)    All other components within normal limits  LIPASE, BLOOD  CBC  URINALYSIS, ROUTINE W REFLEX MICROSCOPIC (NOT AT [facility])  Rosezena SensorI-STAT TROPOININ, ED    Imaging Review Dg Ribs Unilateral W/chest Left  08/24/2015  CLINICAL DATA:  Left anterior rib pain inferiorly with shortness breath 1 week. No injury. EXAM: LEFT RIBS AND CHEST - 3+ VIEW COMPARISON:  11/25/2011 FINDINGS: Lungs are adequately inflated without focal consolidation or effusion. Cardiomediastinal silhouette is within normal. IMPRESSION: Negative. Electronically Signed   By: Elberta Fortisaniel  Boyle M.D.   On: 08/24/2015 16:04   I have personally reviewed and evaluated these images and lab results as part of my medical decision-making.   EKG Interpretation None     Meds given in ED:  Medications  HYDROmorphone (DILAUDID) injection 1 mg (1 mg Intravenous Given 08/24/15 1917)  sodium chloride 0.9 % bolus 1,000 mL (1,000 mLs Intravenous New Bag/Given 08/24/15 1914)  ondansetron (ZOFRAN) injection 4 mg (4 mg Intravenous Given 08/24/15 1914)  gi cocktail (Maalox,Lidocaine,Donnatal) (30 mLs Oral Given 08/24/15 1902)  famotidine (PEPCID) IVPB 20 mg premix (0 mg  Intravenous Stopped 08/24/15 1925)  iopamidol (ISOVUE-370) 76 % injection (100 mLs  Contrast Given 08/24/15 2007)    New Prescriptions   No medications on file     MDM   Final diagnoses:  LUQ pain   55 year old female with LUQ pain and left rib pain. Patient is afebrile, not tachycardic or tachypneic, normotensive, and not hypoxic. She is slightly bradycardic. Abdominal labs are unremarkable. UA is clean. Rib series is negative. Because of the unclear etiology and severity of pain, CT of abdomen and pelvis ordered. Dilaudid given for pain, IVF given. GI cocktail and pepcid given due to her history of excessive NSAID use.   Shared visit with Dr. Silverio LayYao who will add Troponin and CT chest due to crackles heard on exam which is pending. Will hand off patient to Dr. Silverio LayYao.  Tresa EndoKelly  Kipp Brood, PA-C 08/24/15 2013  Richardean Canal, MD 08/24/15 2105

## 2015-08-24 NOTE — ED Notes (Signed)
Pt here for LUQ/rib pain x 1 week and worsening yesterday. sts hurts with deep breath, twisting, and palpation. Denies N,V,D. sts sharp pain. Denies injury,

## 2015-08-24 NOTE — Discharge Instructions (Signed)
Take zpack as prescribed.  Use albuterol every 4 hrs as needed for cough or shortness of breath.   Take prednisone as prescribed.   Take motrin for pain .  Take vicodin for severe pain.   See your doctor next week   Return to ER if you have worse pain, shortness of breath, chest pain.

## 2015-08-24 NOTE — ED Notes (Signed)
Pt has had lt abd pain just below her lt ribs for one week  Worse since yesterday  No n v or diarrhea no temp  Her pain is worse with movement especially when she turns to ly on her lt side. No long car trips no leg pain no air plane  trips

## 2015-08-24 NOTE — ED Notes (Signed)
Iv ND MED GIVEN

## 2015-08-24 NOTE — ED Notes (Signed)
Pt ambulates independently and with steady gait at time of discharge. Discharge instructions and follow up information reviewed with patient. No other questions or concerns voiced at this time.  

## 2018-01-13 IMAGING — DX DG RIBS W/ CHEST 3+V*L*
5 series · 5 of 5 positions shown · non-contrast
Comparison: 11/25/2011

CLINICAL DATA: Left anterior rib pain inferiorly with shortness
breath 1 week. No injury.

EXAM:
LEFT RIBS AND CHEST - 3+ VIEW

[chest pa]
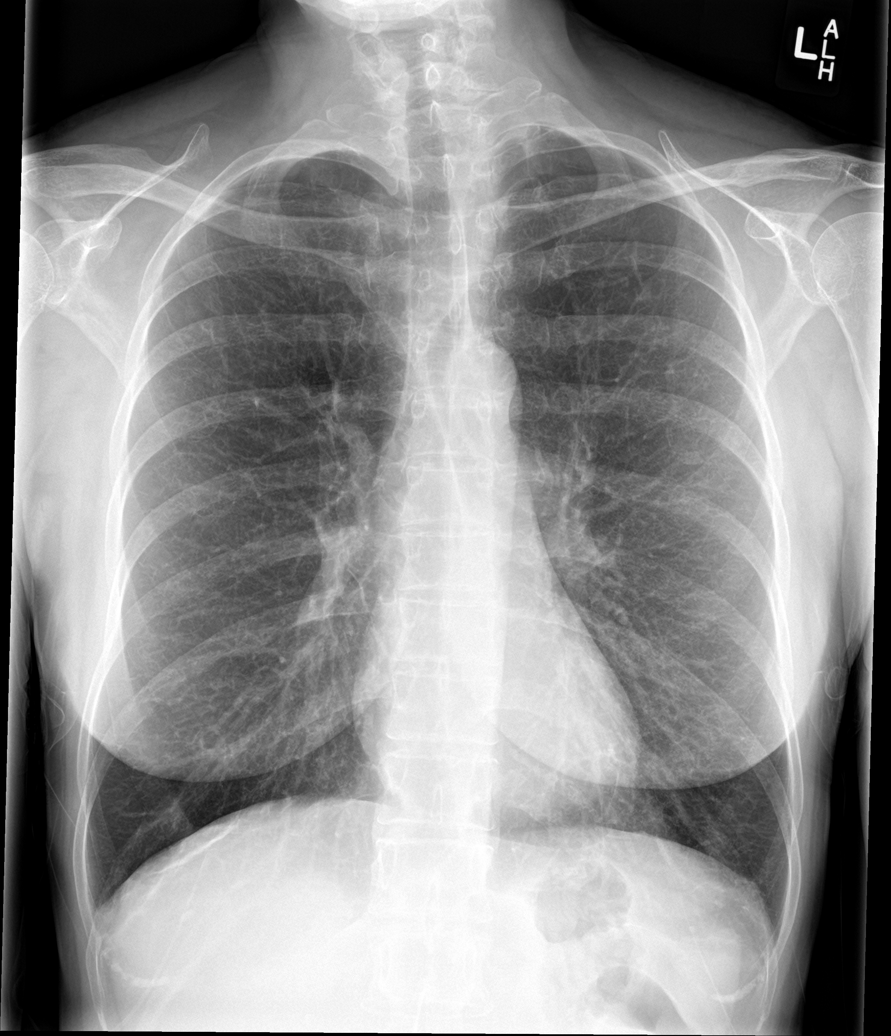

[rib pa obl (1 of 2)]
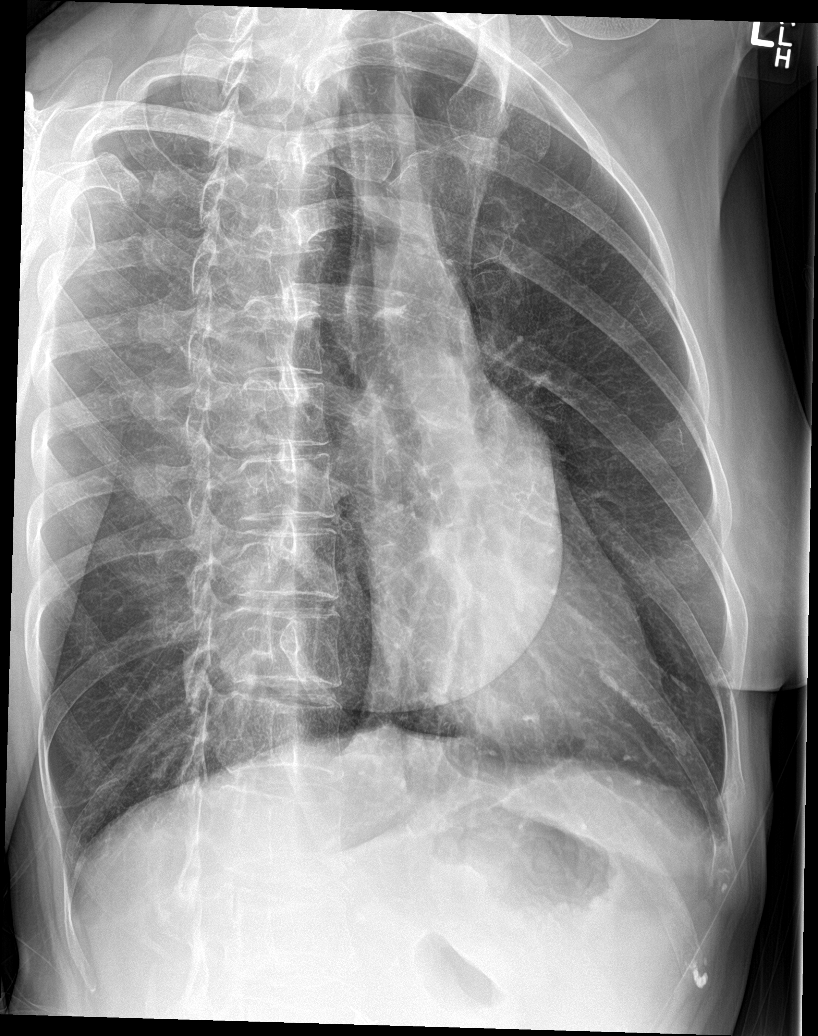

[rib pa obl (2 of 2)]
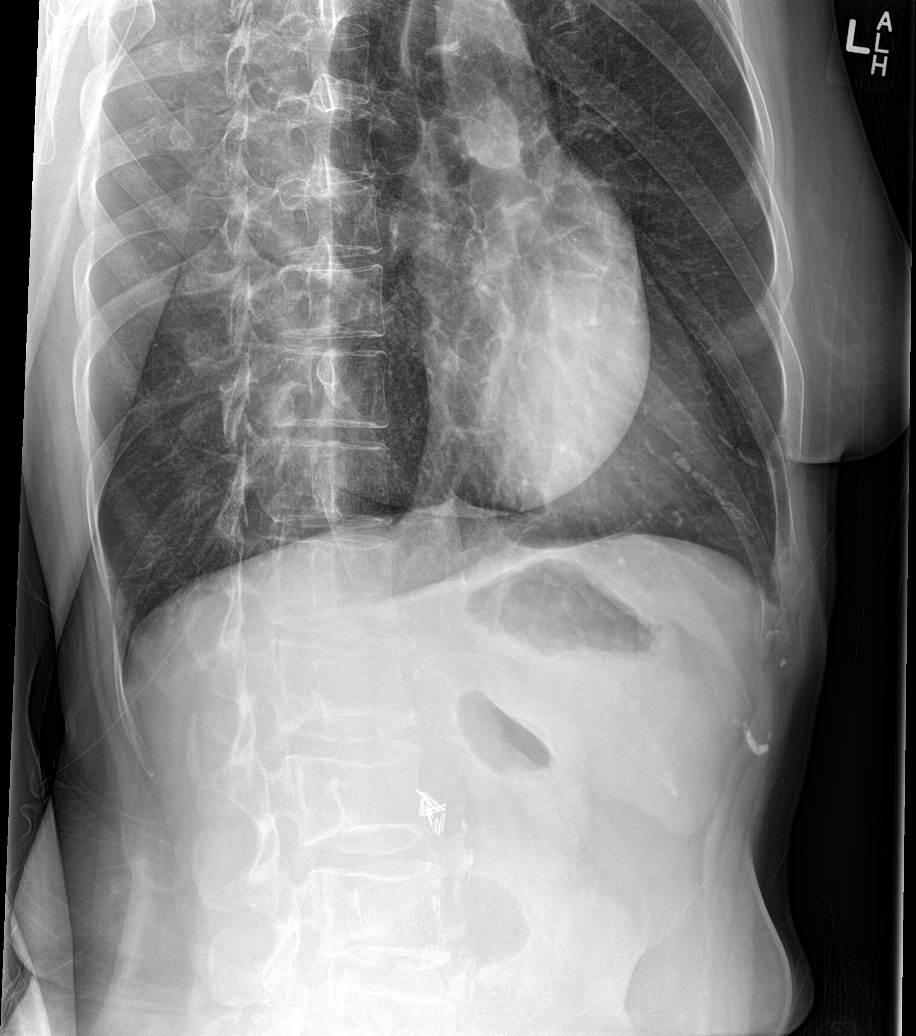

[rib pa (1 of 2)]
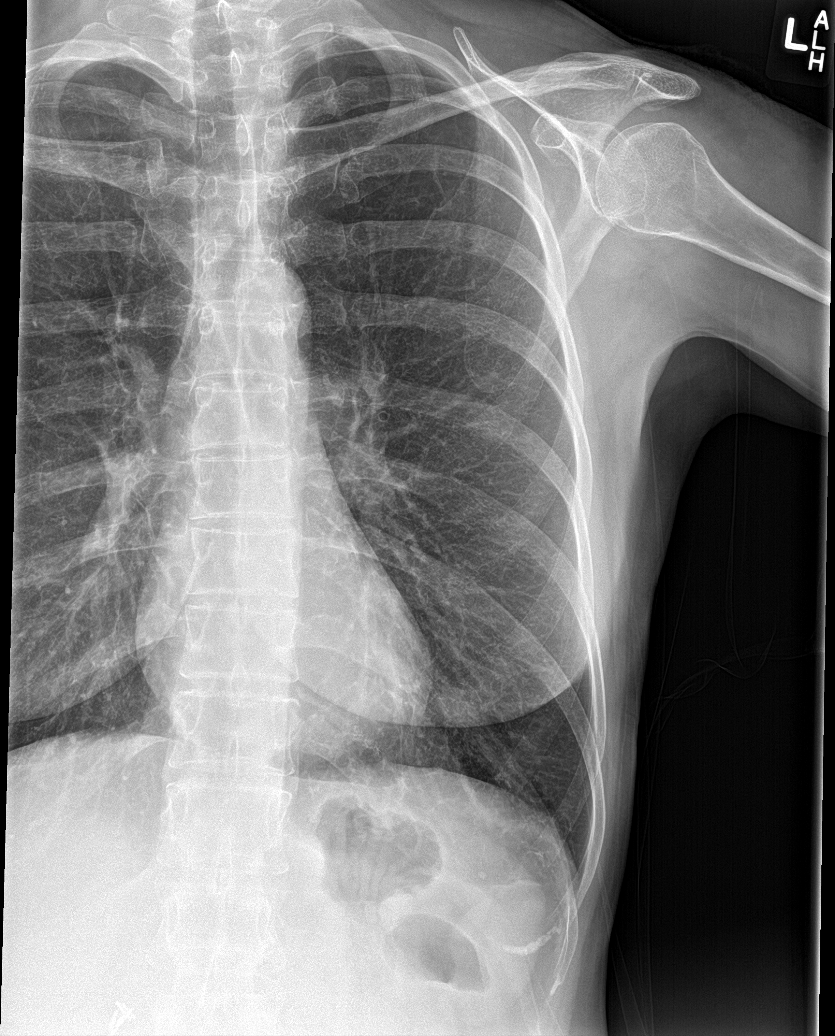

[rib pa (2 of 2)]
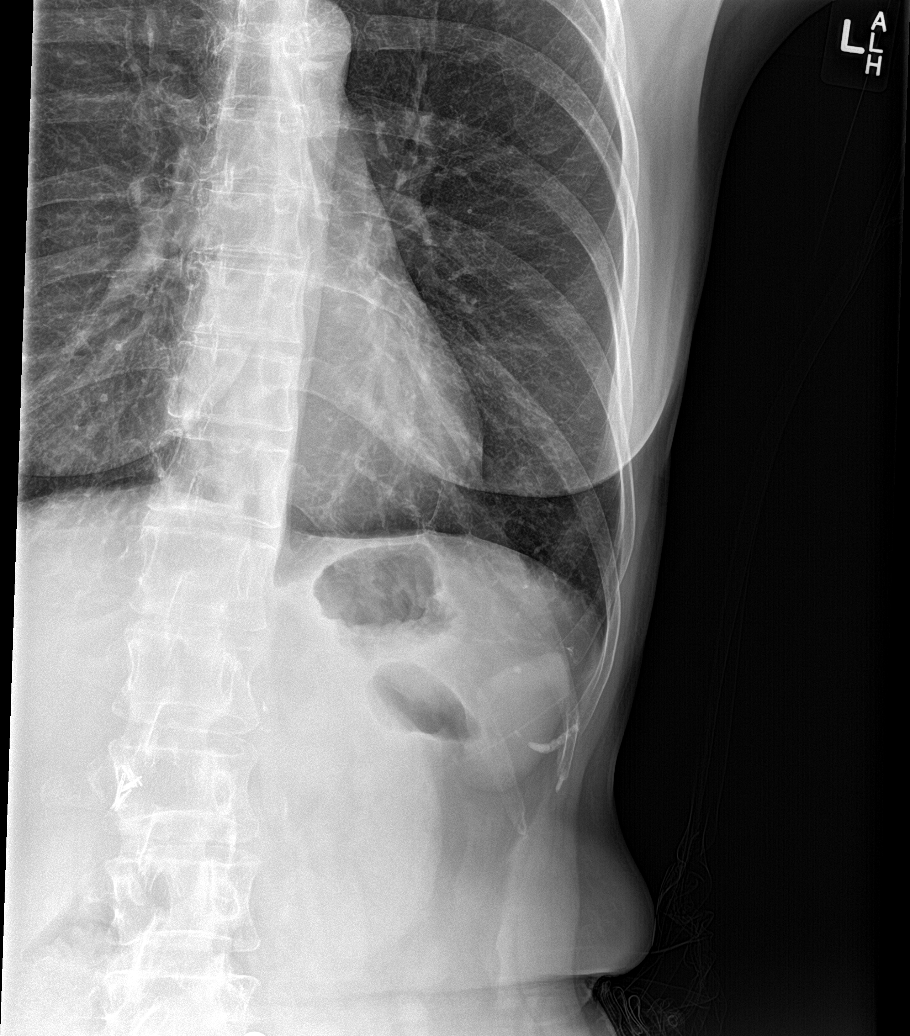

[5 of 5 positions shown; findings below may reference images not displayed]

FINDINGS: Lungs are adequately inflated without focal consolidation or
effusion. Cardiomediastinal silhouette is within normal.
IMPRESSION: Negative.

## 2018-11-23 ENCOUNTER — Other Ambulatory Visit: Payer: Self-pay

## 2018-11-23 ENCOUNTER — Emergency Department (HOSPITAL_COMMUNITY)
Admission: EM | Admit: 2018-11-23 | Discharge: 2018-11-24 | Disposition: A | Discharge status: Home / Self Care | Payer: BC Managed Care – PPO | Attending: Emergency Medicine | Admitting: Emergency Medicine

## 2018-11-23 DIAGNOSIS — F17210 Nicotine dependence, cigarettes, uncomplicated: Secondary | ICD-10-CM | POA: Insufficient documentation

## 2018-11-23 DIAGNOSIS — Z7982 Long term (current) use of aspirin: Secondary | ICD-10-CM | POA: Insufficient documentation

## 2018-11-23 DIAGNOSIS — Z9221 Personal history of antineoplastic chemotherapy: Secondary | ICD-10-CM | POA: Insufficient documentation

## 2018-11-23 DIAGNOSIS — R1110 Vomiting, unspecified: Secondary | ICD-10-CM | POA: Diagnosis not present

## 2018-11-23 DIAGNOSIS — J449 Chronic obstructive pulmonary disease, unspecified: Secondary | ICD-10-CM | POA: Diagnosis not present

## 2018-11-23 DIAGNOSIS — Z79899 Other long term (current) drug therapy: Secondary | ICD-10-CM | POA: Diagnosis not present

## 2018-11-23 DIAGNOSIS — G510 Bell's palsy: Secondary | ICD-10-CM | POA: Diagnosis not present

## 2018-11-23 DIAGNOSIS — T451X5A Adverse effect of antineoplastic and immunosuppressive drugs, initial encounter: Secondary | ICD-10-CM

## 2018-11-23 DIAGNOSIS — R110 Nausea: Secondary | ICD-10-CM | POA: Diagnosis not present

## 2018-11-23 LAB — LIPASE, BLOOD: Lipase: 20 U/L (ref 11–51)

## 2018-11-23 LAB — COMPREHENSIVE METABOLIC PANEL
ALT: 56 U/L — ABNORMAL HIGH (ref 0–44)
AST: 33 U/L (ref 15–41)
Albumin: 4.2 g/dL (ref 3.5–5.0)
Alkaline Phosphatase: 93 U/L (ref 38–126)
Anion gap: 14 (ref 5–15)
BUN: 31 mg/dL — ABNORMAL HIGH (ref 6–20)
CO2: 23 mmol/L (ref 22–32)
Calcium: 9.1 mg/dL (ref 8.9–10.3)
Chloride: 103 mmol/L (ref 98–111)
Creatinine, Ser: 1.12 mg/dL — ABNORMAL HIGH (ref 0.44–1.00)
GFR calc Af Amer: >60 mL/min (ref >60)
GFR calc non Af Amer: 55 mL/min — ABNORMAL LOW (ref >60)
Glucose, Bld: 118 mg/dL — ABNORMAL HIGH (ref 70–99)
Potassium: 3.7 mmol/L (ref 3.5–5.1)
Sodium: 140 mmol/L (ref 135–145)
Total Bilirubin: 0.8 mg/dL (ref 0.3–1.2)
Total Protein: 7 g/dL (ref 6.5–8.1)

## 2018-11-23 LAB — URINALYSIS, ROUTINE W REFLEX MICROSCOPIC
Bilirubin Urine: NEGATIVE
Glucose, UA: 50 mg/dL — AB
Hgb urine dipstick: NEGATIVE
Ketones, ur: 20 mg/dL — AB
Leukocytes,Ua: NEGATIVE
Nitrite: NEGATIVE
Protein, ur: 100 mg/dL — AB
Specific Gravity, Urine: 1.021 (ref 1.005–1.030)
pH: 5 (ref 5.0–8.0)

## 2018-11-23 LAB — CBC
HCT: 40.7 % (ref 36.0–46.0)
Hemoglobin: 12.8 g/dL (ref 12.0–15.0)
MCH: 25.9 pg — ABNORMAL LOW (ref 26.0–34.0)
MCHC: 31.4 g/dL (ref 30.0–36.0)
MCV: 82.4 fL (ref 80.0–100.0)
Platelets: 352 10*3/uL (ref 150–400)
RBC: 4.94 MIL/uL (ref 3.87–5.11)
RDW: 18.2 % — ABNORMAL HIGH (ref 11.5–15.5)
WBC: 3.8 10*3/uL — ABNORMAL LOW (ref 4.0–10.5)
nRBC: 0 % (ref 0.0–0.2)

## 2018-11-23 MED ORDER — SODIUM CHLORIDE 0.9 % IV BOLUS
2000.0000 mL | Freq: Once | INTRAVENOUS | Status: AC
  Administered 2018-11-23: 21:00:00 2000 mL via INTRAVENOUS

## 2018-11-23 MED ORDER — SODIUM CHLORIDE 0.9% FLUSH
3.0000 mL | Freq: Once | INTRAVENOUS | Status: AC
  Administered 2018-11-23: 3 mL via INTRAVENOUS

## 2018-11-23 MED ORDER — METOCLOPRAMIDE HCL 5 MG/ML IJ SOLN
10.0000 mg | Freq: Once | INTRAMUSCULAR | Status: AC
  Administered 2018-11-23: 21:00:00 10 mg via INTRAVENOUS
  Filled 2018-11-23: qty 2

## 2018-11-23 MED ORDER — PROMETHAZINE HCL 25 MG/ML IJ SOLN
25.0000 mg | Freq: Once | INTRAMUSCULAR | Status: AC
  Administered 2018-11-23: 23:00:00 25 mg via INTRAVENOUS
  Filled 2018-11-23: qty 1

## 2018-11-23 NOTE — ED Provider Notes (Signed)
[facility] DEPARTMENT Provider Note   CSN: [id number] Arrival date & time: 11/23/18  1651     History   Chief Complaint Chief Complaint  Patient presents with  . Nausea  . Emesis    HPI Sonya Hogan is a 58 y.o. female.     HPI Patient with history of lung cancer received chemotherapy 6 days ago.  4 days ago developed nausea, vomiting and diarrhea.  Has had multiple episodes of each.  No blood in the stool or vomit.  Patient was having some lightheadedness with standing.  States she is been taking her antiemetic at home with little improvement.  Denies any abdominal pain.  No fever or chills. Past Medical History:  Diagnosis Date  . Bell's palsy 1998  . Cholelithiases   . COPD (chronic obstructive pulmonary disease) ([place]) 2013   by CXR (hyperinflation)  . History of anemia    resolved as of 2011  . Migraines   . Nephrolithiasis   . OP (osteoporosis)   . Ovarian torsion ~2005    Patient Active Problem List   Diagnosis Date Noted  . Sinusitis, acute 03/03/2013  . Abdominal discomfort, epigastric 11/26/2011  . Chest pain 11/26/2011  . Right flank pain 06/23/2011  . Migraine 02/04/2011  . Vertigo 02/04/2011  . LEG PAIN, LEFT 11/27/2009  . BENIGN POSITIONAL VERTIGO 12/26/2008  . ANEMIA 06/20/2007  . TOBACCO ABUSE 06/20/2007  . OSTEOPOROSIS 06/20/2007    Past Surgical History:  Procedure Laterality Date  . APPENDECTOMY    . CESAREAN SECTION     x 2  . CHOLECYSTECTOMY    . LAPAROSCOPIC APPENDECTOMY  07/2010  . LAPAROSCOPY     lysis of adhesions  . TOTAL ABDOMINAL HYSTERECTOMY W/ BILATERAL SALPINGOOPHORECTOMY     for ovarian torsion     OB History   No obstetric history on file.      Home Medications    Prior to Admission medications   Medication Sig Start Date End Date Taking? Authorizing Provider  albuterol (PROVENTIL HFA;VENTOLIN HFA) 108 (90 Base) MCG/ACT inhaler Inhale 1-2 puffs into the lungs every 6 (six)  hours as needed for wheezing or shortness of breath. 08/24/15  Yes Drenda Freeze, MD  aspirin EC 81 MG tablet Take 81 mg by mouth at bedtime.   Yes [provider]  atorvastatin (LIPITOR) 40 MG tablet Take 40 mg by mouth daily. 10/17/18  Yes [provider]  carvedilol (COREG) 3.125 MG tablet Take 3.125 mg by mouth 2 (two) times daily. 08/12/18  Yes [provider]  folic acid (FOLVITE) 1 MG tablet Take 1 mg by mouth daily. 10/24/18 10/24/19 Yes [provider]  LORazepam (ATIVAN) 0.5 MG tablet Take 0.5 mg by mouth every 8 (eight) hours as needed for anxiety. 11/17/18 11/27/18 Yes [provider]  prochlorperazine (COMPAZINE) 10 MG tablet Take 1 tablet by mouth every 6 (six) hours as needed for nausea/vomiting. 10/21/18  Yes [provider]  umeclidinium-vilanterol (ANORO ELLIPTA) 62.5-25 MCG/INH AEPB Inhale 1 puff into the lungs daily. 08/25/18  Yes [provider]  hydrOXYzine (ATARAX/VISTARIL) 25 MG tablet Take 2 tablets (50 mg total) by mouth every 6 (six) hours as needed for nausea or vomiting. 11/24/18   Pollina, Gwenyth Allegra, MD  metoCLOPramide (REGLAN) 10 MG tablet Take 1 tablet (10 mg total) by mouth every 6 (six) hours as needed for nausea or vomiting (nausea/headache). 11/24/18   Orpah Greek, MD    Family History Family  History  Problem Relation Age of Onset  . Cancer Brother        testicular  . Cancer Paternal Grandmother        colon  . Cancer Paternal Aunt        breast  . Cancer Paternal Uncle        lung (nonsmoker)  . Coronary artery disease Maternal Grandmother   . Stroke Mother [id number]  . Diabetes Mother     Social History Social History   Tobacco Use  . Smoking status: Current Every Day Smoker    Packs/day: 0.30    Types: Cigarettes  Substance Use Topics  . Alcohol use: Yes    Comment: Occasional wine  . Drug use: No     Allergies   Chantix [varenicline] and Penicillins   Review of  Systems Review of Systems  Constitutional: Negative for chills and fever.  HENT: Negative for sore throat and trouble swallowing.   Eyes: Negative for visual disturbance.  Respiratory: Negative for cough and shortness of breath.   Cardiovascular: Negative for chest pain, palpitations and leg swelling.  Gastrointestinal: Positive for diarrhea, nausea and vomiting. Negative for abdominal pain and constipation.  Genitourinary: Negative for dysuria, flank pain and frequency.  Musculoskeletal: Negative for back pain, myalgias and neck pain.  Skin: Negative for rash and wound.  Neurological: Positive for light-headedness. Negative for syncope, weakness, numbness and headaches.  All other systems reviewed and are negative.    Physical Exam Updated Vital Signs BP 113/68   Pulse 70   Temp 98.2 F (36.8 C) (Oral)   Resp (!) 21   SpO2 94%   Physical Exam Vitals signs and nursing note reviewed.  Constitutional:      Appearance: Normal appearance. She is well-developed.  HENT:     Head: Normocephalic and atraumatic.     Nose: Nose normal.     Mouth/Throat:     Mouth: Mucous membranes are moist.  Eyes:     Pupils: Pupils are equal, round, and reactive to light.  Neck:     Musculoskeletal: Normal range of motion and neck supple. No neck rigidity or muscular tenderness.  Cardiovascular:     Rate and Rhythm: Normal rate and regular rhythm.     Heart sounds: No murmur. No friction rub. No gallop.   Pulmonary:     Effort: Pulmonary effort is normal. No respiratory distress.     Breath sounds: Normal breath sounds. No stridor. No wheezing, rhonchi or rales.  Chest:     Chest wall: No tenderness.  Abdominal:     General: Bowel sounds are normal. There is no distension.     Palpations: Abdomen is soft. There is no mass.     Tenderness: There is no abdominal tenderness. There is no right CVA tenderness, left CVA tenderness, guarding or rebound.     Hernia: No hernia is present.   Musculoskeletal: Normal range of motion.        General: No swelling, tenderness, deformity or signs of injury.     Right lower leg: No edema.     Left lower leg: No edema.  Lymphadenopathy:     Cervical: No cervical adenopathy.  Skin:    General: Skin is warm and dry.     Capillary Refill: Capillary refill takes less than 2 seconds.     Findings: No erythema or rash.  Neurological:     General: No focal deficit present.     Mental Status: She is alert and  oriented to person, place, and time.  Psychiatric:        Mood and Affect: Mood normal.        Behavior: Behavior normal.      ED Treatments / Results  Labs (all labs ordered are listed, but only abnormal results are displayed) Labs Reviewed  COMPREHENSIVE METABOLIC PANEL - Abnormal; Notable for the following components:      Result Value   Glucose, Bld 118 (*)    BUN 31 (*)    Creatinine, Ser 1.12 (*)    ALT 56 (*)    GFR calc non Af Amer 55 (*)    All other components within normal limits  CBC - Abnormal; Notable for the following components:   WBC 3.8 (*)    MCH 25.9 (*)    RDW 18.2 (*)    All other components within normal limits  URINALYSIS, ROUTINE W REFLEX MICROSCOPIC - Abnormal; Notable for the following components:   APPearance HAZY (*)    Glucose, UA 50 (*)    Ketones, ur 20 (*)    Protein, ur 100 (*)    Bacteria, UA RARE (*)    All other components within normal limits  LIPASE, BLOOD    EKG EKG Interpretation  Date/Time:  Wednesday November 23 2018 21:25:53 EDT Ventricular Rate:  69 PR Interval:    QRS Duration: 78 QT Interval:  430 QTC Calculation: 461 R Axis:   84 Text Interpretation:  Sinus rhythm Ventricular premature complex Borderline T abnormalities, diffuse leads Confirmed by Loren Racer ([id number]) on 11/23/2018 9:28:18 PM Also confirmed by Loren Racer ([id number]), editor Dorothyann Gibbs [phone])  on 11/24/2018 8:34:51 AM   Radiology No results found.  Procedures Procedures  (including critical care time)  Medications Ordered in ED Medications  sodium chloride flush (NS) 0.9 % injection 3 mL (3 mLs Intravenous Given 11/23/18 2118)  sodium chloride 0.9 % bolus 2,000 mL (0 mLs Intravenous Stopped 11/23/18 2259)  metoCLOPramide (REGLAN) injection 10 mg (10 mg Intravenous Given 11/23/18 2118)  promethazine (PHENERGAN) injection 25 mg (25 mg Intravenous Given 11/23/18 2259)  LORazepam (ATIVAN) injection 1 mg (1 mg Intravenous Given 11/24/18 0049)  dexamethasone (DECADRON) injection 10 mg (10 mg Intravenous Given 11/24/18 0049)  diphenhydrAMINE (BENADRYL) injection 25 mg (25 mg Intravenous Given 11/24/18 0049)     Initial Impression / Assessment and Plan / ED Course  I have reviewed the triage vital signs and the nursing notes.  Pertinent labs & imaging results that were available during my care of the patient were reviewed by me and considered in my medical decision making (see chart for details).        Patient states nausea is mildly improved.  She did have one episode of vomiting in emergency department.  Will re-dose antiemetic.  Signout oncoming emergency provider pending reevaluation.  Anticipate discharge home. Final Clinical Impressions(s) / ED Diagnoses   Final diagnoses:  Nausea  Vomiting due to chemotherapy    ED Discharge Orders         Ordered    hydrOXYzine (ATARAX/VISTARIL) 25 MG tablet  Every 6 hours PRN     11/24/18 0350    metoCLOPramide (REGLAN) 10 MG tablet  Every 6 hours PRN     11/24/18 0350           Loren Racer, MD 11/24/18 1242

## 2018-11-23 NOTE — ED Triage Notes (Signed)
Pt presents w/N/V/D starting sautrday. She completed her 2nd chemo treatment at [facility] in Thursday. Pt receiving chemo for Lung cancer

## 2018-11-24 MED ORDER — HYDROXYZINE HCL 25 MG PO TABS: 50.0000 mg | ORAL_TABLET | Freq: Four times a day (QID) | ORAL | 0 refills | Status: AC | PRN

## 2018-11-24 MED ORDER — DROPERIDOL 2.5 MG/ML IJ SOLN: 1.2500 mg | Freq: Once | INTRAMUSCULAR | Status: DC

## 2018-11-24 MED ORDER — DEXAMETHASONE SODIUM PHOSPHATE 10 MG/ML IJ SOLN
10.0000 mg | Freq: Once | INTRAMUSCULAR | Status: AC
  Administered 2018-11-24: 01:00:00 10 mg via INTRAVENOUS
  Filled 2018-11-24: qty 1

## 2018-11-24 MED ORDER — LORAZEPAM 2 MG/ML IJ SOLN
1.0000 mg | Freq: Once | INTRAMUSCULAR | Status: AC
  Administered 2018-11-24: 1 mg via INTRAVENOUS
  Filled 2018-11-24: qty 1

## 2018-11-24 MED ORDER — DIPHENHYDRAMINE HCL 50 MG/ML IJ SOLN
25.0000 mg | Freq: Once | INTRAMUSCULAR | Status: AC
  Administered 2018-11-24: 01:00:00 25 mg via INTRAVENOUS
  Filled 2018-11-24: qty 1

## 2018-11-24 MED ORDER — METOCLOPRAMIDE HCL 10 MG PO TABS: 10.0000 mg | ORAL_TABLET | Freq: Four times a day (QID) | ORAL | 0 refills | Status: AC | PRN

## 2018-11-24 NOTE — ED Provider Notes (Signed)
Patient presented for evaluation of nausea and vomiting secondary to chemotherapy.  Case signed out to me by Dr. Lita Mains to see if patient improved after treatment.  She had received Reglan and Phenergan prior to my arrival.  Recheck revealed that she was still feeling very nauseated.  Patient has been well hydrated prior to my arrival.  No signs of clinical dehydration currently.  She was administered IV Decadron, IV Ativan, IV Benadryl.  Her symptoms have dramatically improved.  She will be discharged, given prescriptions for additional antiemetics, follow-up with oncology.   Orpah Greek, MD 11/24/18 [phone]

## 2018-11-24 NOTE — ED Notes (Signed)
Discharge instructions discussed with pt. Pt verbazlied understanding with no questions at this time

## 2018-11-24 NOTE — Discharge Instructions (Signed)
Try taking Tylenol with the Zofran to see if you can tolerate it without getting a headache.  Try combinations of the other nausea medications to see which ones help you the best.

## 2019-06-10 ENCOUNTER — Ambulatory Visit: Payer: BC Managed Care – PPO | Attending: Internal Medicine

## 2019-06-10 DIAGNOSIS — Z23 Encounter for immunization: Secondary | ICD-10-CM

## 2019-06-10 NOTE — Progress Notes (Signed)
   Covid-19 Vaccination Clinic  Name:  Sonya Hogan    MRN: [id number] DOB: October 14, 1960  06/10/2019  [name] was observed post Covid-19 immunization for 15 minutes without incident. She was provided with Vaccine Information Sheet and instruction to access the V-Safe system.   [name] was instructed to call 911 with any severe reactions post vaccine: Marland Kitchen Difficulty breathing  . Swelling of face and throat  . A fast heartbeat  . A bad rash all over body  . Dizziness and weakness   Immunizations Administered    Name Date Dose VIS Date Route   Pfizer COVID-19 Vaccine 06/10/2019  8:44 AM 0.3 mL 02/17/2019 Intramuscular   Manufacturer: [employer], [employer]   Lot: [id number]   NDC: [address]-7

## 2019-07-04 ENCOUNTER — Ambulatory Visit: Payer: BC Managed Care – PPO

## 2019-07-17 ENCOUNTER — Ambulatory Visit: Payer: BC Managed Care – PPO | Attending: Internal Medicine

## 2019-07-17 DIAGNOSIS — Z23 Encounter for immunization: Secondary | ICD-10-CM

## 2019-07-17 NOTE — Progress Notes (Signed)
   Covid-19 Vaccination Clinic  Name:  MADELEYN SCHWIMMER    MRN: [id number] DOB: 1960/09/18  07/17/2019  Ms. Rodden was observed post Covid-19 immunization for 15 minutes without incident. She was provided with Vaccine Information Sheet and instruction to access the V-Safe system.   Ms. Rae was instructed to call 911 with any severe reactions post vaccine: Marland Kitchen Difficulty breathing  . Swelling of face and throat  . A fast heartbeat  . A bad rash all over body  . Dizziness and weakness   Immunizations Administered    Name Date Dose VIS Date Route   Pfizer COVID-19 Vaccine 07/17/2019 10:53 AM 0.3 mL 05/03/2018 Intramuscular   Manufacturer: [employer], [employer]   Lot: [id number]   NDC: [address]-7

## 2023-09-07 NOTE — Progress Notes (Signed)
 MEDICAL ONCOLOGY NOTE 09/08/2023  2:44 PM  PATIENT PROFILE: [name] is a 63 y.o.  presents with a diagnosis of NSCLC - Adenocarcinoma of left lung.  Referring physician: Dr.Self No address on file    Patient Care Team: Vaughn Domino, [place] as PCP - General Jacqualyn Kent, MD as Cardiologist (Cardiovascular Disease)  ONCOLOGIC PROBLEM LIST:  Staging: Stage IV  Mutational Analysis: - PDL1: <1%  HISTORY OF PRESENT ILLNESS:  Sonya Hogan is a 63 y.o. female (current smoker 0.5ppd) w/ PMHx of CAD, HLD, mild centrilobular emphysema, OA, Hx of Bell's Palsy, and Hx of migraines who presents with a new diagnosis of lung cancer.   Patient was first noted of have pulmonary nodules under surveillance CT from 02/19/2016 with bilateral pulmonary nodules measure up to 7 mm in the left upper lobe. It appears this was lost to follow-up until she had follow-up with her cardiologist in June 2020 who recommended she undergo follow-up CT. CT on 08/24/18 showed a spiculated left upper lobe nodule and she was referred to Pulmonology (Dr. Javaid) in consultation. PFTs were done at that time that showed moderate obstruction. She underwent a PET/CT on 09/08/2018 that showed the left upper lobe nodule to be markedly PET avid but also showed some activity a left hilar node and AP window node.   Ultimately, on 10/04/18 she underwent Bronch with EBUS with FNA of station 10L lymph node. Pathology shows Adenocarcinoma. PDL1 testing performed showing <1%.  Found to have metastatic disease to liver, bones, and 4mm R temporal lobe lesion 8/22.   INTERVAL HISTORY:    Sonya Hogan returns for follow-up today. She reports that the loose stools have returned, on average 4x/day. No blood in stool. She takes imodium which will give her a day of relief but then diarrhea returns. She has had left posterior thorax discomfort the last few weeks, denies any trauma to this area. She is taking acetaminophen  with  mild relief. No worsening DOE, cough.  No other new specific complaints today.   REVIEW OF SYSTEMS: A comprehensive review of systems was completed and noncontributory except as outlined in the interval history and as reported in the patient self assessment tool.   YES answers are indicated in the tool. All blank spaces indicate a NO answer  PHYSICAL SYMPTOMS Response Comments  Appearance (including alopecia)  no   Bathing/Dressing  no   Breathing  no   Changes in urination (including dysuria)  no   Constipation  no   Diarrhea no  None current - did have several days during recent GI virus, now resolved.  Eating (including change in taste and appetite)  no   Fatigue  no   Feeling swollen (including abdominal swelling)  no   Fevers  no   Getting around  no   Indigestion  no   Memory/Concentration (including  no   Mouth sores  no   Nausea and/or vomiting  no   Nose dry/congested  no   Pain (any location)  no   Sexual issues  no   Skin dry/itchy  no   Sleep  no   Tingling in hands/feet  no        PAST MEDICAL HISTORY: Past Medical History:  Diagnosis Date  . CAD (coronary artery disease)   . COPD (chronic obstructive pulmonary disease) (CMS/HHS-HCC)   . HLD (hyperlipidemia)   . Personal history of lung cancer    LUL    Past Surgical History:  Procedure Laterality Date  .  THORACOSCOPY WITH LOBECTOMY LUNG ROBOT ASSISTED Left 02/27/2019   Procedure: ROBOT ASSISTED Left  THORACOSCOPY, SURGICAL; WITH LOBECTOMY (SINGLE LOBE);  Surgeon: Elmo Donnice Pinch, MD;  Location: [facility];  Service: Cardiothoracic;  Laterality: Left;  . THORACOSCOPY WITH MEDIASTINAL AND REGIONAL LYMPHADENECTOMY Left 02/27/2019   Procedure: THORACOSCOPY, SURGICAL; WITH MEDIASTINAL AND REGIONAL LYMPHADENECTOMY (LIST IN ADDITION TO PRIMARY PROCEDURE);  Surgeon: Elmo Donnice Pinch, MD;  Location: [facility];  Service: Cardiothoracic;  Laterality: Left;  SABRA MEDIASTINOSCOPY N/A  02/27/2019   Procedure: MEDIASTINOSCOPY; WITH LYMPH NODE BIOPSY(IES) (EG, LUNG CANCER STAGING);  Surgeon: Elmo Donnice Pinch, MD;  Location: [facility];  Service: Cardiothoracic;  Laterality: N/A;  . THORACOTOMY WITH DIAGNOSTIC WEDGE RESECTION FOLLOWED BY ANATOMIC LUNG RESECTION Left 02/27/2019   Procedure: THORACOTOMY; WITH DIAGNOSTIC LOBEECTOMY  (LIST IN ADDITION TO PRIMARY PROCEDURE);  Surgeon: Elmo Donnice Pinch, MD;  Location: [facility];  Service: Cardiothoracic;  Laterality: Left;  . THORACOSCOPY WITH DECORTICATION LUNG Left 03/18/2019   Procedure: THORACOSCOPY, SURGICAL; WITH TOTAL PULMONARY DECORTICATION, INCLUDING INTRAPLEURAL PNEUMONOLYSIS;  Surgeon: Elmo Donnice Pinch, MD;  Location: [facility];  Service: Cardiothoracic;  Laterality: Left;  . APPENDECTOMY    . CESAREAN SECTION    . CHOLECYSTECTOMY    . HYSTERECTOMY    . OOPHORECTOMY     for benign tumor    FAMILY HISTORY: Family History  Problem Relation Name Age of Onset  . Diabetes Mother    . Stroke Mother    . Testicular cancer Brother    . Breast cancer Paternal Aunt    . Lung cancer Paternal Aunt    . Stomach cancer Paternal Uncle    . Colon cancer Paternal Grandmother    . Myocardial Infarction (Heart attack) Brother    . Breast cancer Paternal Aunt    . Anesthesia problems Neg Hx      SOCIAL HISTORY: Social History   Socioeconomic History  . Marital status: Married  Tobacco Use  . Smoking status: Former    Current packs/day: 0.00    Average packs/day: 0.3 packs/day for 30.0 years (7.5 ttl pk-yrs)    Types: Cigarettes    Start date: 02/17/1989    Quit date: 02/18/2019    Years since quitting: 4.5  . Smokeless tobacco: Never  . Tobacco comments:    Not smoking quit , 02/26/2019  Vaping Use  . Vaping status: Never Used  Substance and Sexual Activity  . Drug use: Never   Social Drivers of Corporate investment banker Strain: Low Risk  (05/06/2023)   Received from  [facility]   Overall Financial Resource Strain (CARDIA)   . Difficulty of Paying Living Expenses: Not hard at all  Food Insecurity: No Food Insecurity (05/06/2023)   Received from [facility]   Hunger Vital Sign   . Within the past 12 months, you worried that your food would run out before you got the money to buy more.: Never true   . Within the past 12 months, the food you bought just didn't last and you didn't have money to get more.: Never true  Transportation Needs: No Transportation Needs (05/06/2023)   Received from [facility] - Transportation   . Lack of Transportation (Medical): No   . Lack of Transportation (Non-Medical): No  Physical Activity: Unknown (05/06/2023)   Received from [facility]   Exercise Vital Sign   . On average, how many days per week do you engage in moderate to strenuous exercise (  like a brisk walk)?: 0 days  Stress: No Stress Concern Present (05/06/2023)   Received from [facility] - Occupational Stress Questionnaire   . Feeling of Stress : Not at all  Social Connections: Socially Integrated (05/06/2023)   Received from [facility]   Social Network   . How would you rate your social network (family, work, friends)?: Good participation with social networks  Housing Stability: Low Risk  (05/06/2023)   Received from [facility] Stability Vital Sign   . In the last 12 months, was there a time when you were not able to pay the mortgage or rent on time?: No   . In the past 12 months, how many times have you moved where you were living?: 0   . At any time in the past 12 months, were you homeless or living in a shelter (including now)?: No    ALLERGY: Allergies  Allergen Reactions  . Penicillins Hives    rash rash   . Varenicline Nausea    nausea nausea    CURRENT MEDS:  Current Outpatient Medications  Medication Sig Dispense Refill  . acetaminophen  (TYLENOL ) 500 mg capsule  Take 1,000 mg by mouth every 8 (eight) hours as needed Every 4 hours    . aspirin 81 MG EC tablet Take 81 mg by mouth nightly       . carvediloL (COREG) 3.125 MG tablet Take 3.125 mg by mouth 2 (two) times daily    . cetirizine (ZYRTEC) 5 MG chewable tablet Take 5 mg by mouth once daily    . LORazepam  (ATIVAN ) 0.5 MG tablet Take 1-2 tablets (0.5-1 mg total) by mouth every 8 (eight) hours as needed for Anxiety 90 tablet 1  . multivitamin tablet Take 1 tablet by mouth once daily    . traZODone (DESYREL) 50 MG tablet TAKE 1 TABLET BY MOUTH EVERYDAY AT BEDTIME 90 tablet 3  . TRELEGY ELLIPTA 100-62.5-25 mcg inhaler Inhale 1 Puff into the lungs once daily    . albuterol  90 mcg/actuation inhaler Inhale 1 inhalation into the lungs every 6 (six) hours as needed       . HYDROcodone -chlorpheniramine (TUSSIONEX) 10-8 mg/5 mL ER suspension Take 5 mLs by mouth every 12 (twelve) hours as needed for Cough (Patient not taking: Reported on 03/18/2023) 115 mL 0  . polyethylene glycol (MIRALAX) packet Take 1 packet (17 g total) by mouth once daily as needed for Constipation Mix in 4-8ounces of fluid prior to taking. 14 each 0  . sennosides-docusate (SENOKOT-S) 8.6-50 mg tablet Take 2 tablets by mouth once daily (Patient not taking: Reported on 04/30/2022) 28 tablet 0   No current facility-administered medications for this visit.    PHYSICAL EXAM: Vitals:   09/08/23 1402  BP: 117/71  Pulse: 64  Resp: 17  Temp: 36.5 C (97.7 F)  TempSrc: Oral  SpO2: 99%  Weight: 58.9 kg (129 lb 13.6 oz)  Height: 163 cm (5' 4.17)  PainSc: 0-No pain       ECOG Performance Status: 1  GENERAL APPEARANCE: Appears well, in no acute distress. Fully ambulatory. Speaks without hoarseness or dyspnea. SKIN: No rashes, bruises, or  abnormalities HEENT: PERRLA, EOMI, sclerae anicteric.  Oropharynx clear without lesions, exudates or ulceration.   Neck: Supple without JVD or thyromegaly.   Lymph Nodes: No cervical, supraclavicular  lymphadenopathy. Cardiac:   RRR, without murmur, rub or gallop.  Lungs: Breath sounds with scattered wheezes throughout. Normal work  of breathing Thorax: Well healed surgical incisions. No masses, no tenderness.  Abdomen: Bowel sounds present. Soft, non tender and non distended. No hepatosplenomegaly. No palpable abdominal masses. Extremities: No clubbing, cyanosis or edema. Neurologic:  Alert and oriented X4. L-sided Bell's palsy noted (chronic), other CN's intact.    RADIOLOGY: CT abdomen with contrast Result Date: 09/08/2023 CT abdomen with IV contrast Comparison:  06/09/2023 Indication:  Non-small cell lung cancer (NSCLC), monitor, C34.90 Malignant neoplasm of unspecified part of unspecified bronchus or lung (CMS/HHS-HCC) Technique:  CT imaging was performed of the abdomen following the administration of intravenous contrast.  Iodinated contrast was administered to improve disease detection and to further define anatomy. Coronal and sagittal reformatted images were generated and reviewed. Findings:  - Lower Thorax: Please see separately dictated same day CT chest for findings above the diaphragm. - Liver: Normal in morphology and enhancement.  No suspicious hepatic masses are identified. 5 mm low-attenuation lesion in the posterior right hepatic lobe is stable compared to prior. Redemonstrated focal fatty infiltration at the falciform ligament. The portal and hepatic veins are patent. - Biliary and Gallbladder: No intrahepatic or extrahepatic bile duct dilatation. The gallbladder is surgically absent. - Spleen: Normal in appearance.  - Pancreas: Normal in appearance. - Adrenal Glands: Normal in appearance. - Kidneys: No suspicious renal lesions. Small left renal cyst. Bilateral renal hypodensities too small to characterize. Similar prominence of the right renal collecting system. No hydronephrosis. - Abdominal Vasculature: No abdominal aortic aneurysm. Severe atherosclerotic plaque. - Gastrointestinal  Tract: No abnormal dilation or wall thickening. - Peritoneum/Mesentery/Retroperitoneum: No free fluid.  No free intraperitoneal air. - Lymph Nodes: No retroperitoneal or mesenteric lymphadenopathy.  - Body Wall: Unremarkable. - Musculoskeletal:  No aggressive appearing osseous lesions. Impression: No evidence of metastatic disease in the abdomen. Please see separately dictated same day CT chest for findings above the diaphragm. Electronically Reviewed by:  Richarda Pinal, MD, [facility] Electronically Reviewed on:  09/08/2023 2:37 PM  CT chest split study with contrast with MIPS Result Date: 09/08/2023 CT CHEST SPLIT STUDY WITH CONTRAST WITH MIPS INDICATION: Non-small cell lung cancer (NSCLC), monitor, lung cancer, C34.90 Malignant neoplasm of unspecified part of unspecified bronchus or lung (CMS/HHS-HCC) COMPARISON:  Multiple priors, most recent chest CT dated June 09, 2023 PROTOCOL:  CT Chest with Contrast.  Volumetric axial images were obtained from the neck base through the upper abdomen following intravenous administration of iodinated contrast material.  If IV contrast material had not been administered, the likelihood of detecting abnormalities relevant to the patient's condition would have been substantially decreased.  In addition, coronal and sagittal reconstructions were performed to facilitate interpretation.  Likewise, 3-D maximal intensity projection (MIP) reconstructions were performed to potentially increase the sensitivity for the detection of pulmonary nodules. FINDINGS: Heart and pericardium: Normal heart size. No pericardial effusion. Normal caliber thoracic aorta with moderate atherosclerotic disease. Severe three-vessel coronary artery calcifications. Mediastinum: Small hiatal hernia. Thyroid is unremarkable. No enlarged lymph nodes seen in the chest. Lungs and airways: Stable postsurgical changes of prior left upper lobectomy. No new or enlarging pulmonary nodules. Stable scattered  small pulmonary nodules. Reference solid nodule of the left lower lobe measuring 4 mm on series 4 image 163. No pleural effusion. Upper abdomen:See separately dictated same-day CT of the abdomen. Musculoskeletal: Soft tissues are unremarkable. No aggressive appearing osseous lesion. IMPRESSION: 1.  Stable postsurgical changes of prior left upper lobectomy. No evidence of new or worsening disease in the chest. 2.  See separately dictated same-day CT  of the abdomen for discussion of findings below the diaphragm. Electronically Signed by:  Rea Marc, MD, [facility] Electronically Signed on:  09/08/2023 1:33 PM      PATHOLOGY:  Lab Results - documented in this encounter  Fine Needle Aspirate Request (10/04/2018 1:11 PM EDT) Fine Needle Aspirate Request (10/04/2018 1:11 PM EDT)  Component Value Ref Range Performed At Pathologist Signature  Case Report FNA Non-GYN Cytology                              Case: [id number]                                 Authorizing Provider:  Alm Dasen, MD            Collected:           10/04/2018 1311             Ordering Location:     [facility]   Received:            10/04/2018 1348             Pathologist:           Lonni JONELLE Cohens, MD                                                    Specimen:    Lymph Node Station 10, Left, 10 L - FNA                                                 [facility]    Addendum 1 PD-L1 testing performed at [facility] using 22C3 Sigmund) antibody showed the following results:  TPS: <1% No Expression  See scanned report.   [facility] Addendum electronically signed by Pasco Francis Remington, MD on 10/10/2018 at 1:22 PM  Final Diagnosis Left Hilar Lymph Node, station 10L; FNA (smears, cell block): Adenocarcinoma. See comment.  Cytotechnologist: Ronal Landry Manning, SCT(ASCP)   [facility] Electronically signed by Lonni JONELLE Cohens, MD on 10/05/2018 at 11:53 AM  Diagnosis  Comment Molecular studies and IHC stain (PD-L1) will be attempted on this material and reported in an addendum.   [facility]    Clinical Information Diagnosis: R59.0 - Mediastinal lymphadenopathy [ICD-10-CM]   [facility]    Gross Description Received are 3 air dried and 3 wet-fixed slide(s) along with 5 cc of cloudy red fluid from which one cell block is prepared.   [facility]    Immediate Cytological Interpretation An on site evaluation was rendered by a qualified cytotechnologist.   [facility]    Disclaimer The above diagnosis is based on performance of thorough gross and/or microscopic evaluations. Immunoperoxidase procedures were developed and performance characteristics determined by [facility]. Not all have been cleared or approved by the U.S. Food and Drug Administration.   For Flow Cytometry and Class I analyte-specific reagents (ASRs): This test was developed and its performance characteristics determined by [facility]. It has not been  cleared or approved by the US  Food and Drug Administration. The FDA does not require this test to go through premarket FDA review. This test is used for clinical purposes. It should not be regarded as investigational or for research. This laboratory is certified under the Clinical Laboratory Improvement Amendments (CLIA) as qualified to perform high complexity clinical laboratory testing.  [facility] CLIA Medical Director: Rankin GORMAN Fellows, MD        Guardant 360 11/21/20: [id number]  LABORATORY:   Hospital Outpatient Visit on 09/08/2023  Component Date Value Ref Range Status  . POC Creatinine 09/08/2023 0.9  0.4 - 1.0 mg/dL Final     ASSESSMENT AND PLAN: Ms. KYRIANA YANKEE is a 63 y.o. female with a diagnosis of NSCLC - Adenocarcinoma of the Left Lung (PDL1 <1%) with hypermetabolic AP window and left hilar lymph nodes, IIIA.  In  addition to the hypermetabolic left upper lobe spiculated mass that has increased in size over the past 2 and half years which is certainly her primary malignancy, there is a second spiculated solid subcentimeter lesion in the left upper lobe.  The second lesion has remained relatively stable but is still worrisome for a synchronous primary.  Given the fact that she is PDL 1 negative and there is the possibility of a second synchronous primary in the left upper lobe my recommendation was that she be treated with neoadjuvant chemotherapy, followed by resection, and adjuvant thoracic radiation. She has received 2 cycles of CDDP/pemetrexed with considerable nausea so the cisplatin was changed to carboplatin for the final 2 cycles. CT after 2 cycles of chemotherapy and again after 4 cycles showed SD.  Resection was performed in Dec 2020, and adjuvant radiation was completed as well. 06/2020 CT shows NED.   10/14/20 CT revealed new right lung nodules and interval increase in size of left pleural-based soft tissue lesion measuring 21 x 10 mm.  11/05/20 PET/CT Hypermetabolic mediastinal lymph nodes, hepatic dome liver lesion, and multifocal osseous lesions consistent with metastatic disease. MRI brain 11/20/20 revealing new 4 mm enhancing lesion in right temporal lobe.  [id number] with no actionable mutations.   Completed RT to 5th rib lesion.  01/06/21 CT c/a/p: increase left-sided subcarinal mass and lower paraesophageal node and left pleural mass as well as increase in size of liver lesions   01/06/21 MRI brain 7mm peripherally enhancing 7 mm lesion within the right temporal lobe, previously 5 mm  01/08/21 C1D1 of treatment on ipi/nivo/repatha clinical trial  CT scan 02/20/21 shows significant reduction in the size of her metastatic lesions in the chest and abdomen.  Also the small metastasis in the brain is decreased in size as well.  04/03/21 CT with continued decrease in size of hepatic metastases,  otherwise stable imaging.  05/12/21 MRI brain decreased size of right temporal lobe lesion.   CT performed 08/06/21 shows no evidence of progression. MRI brain from 08/06/21 reveals no evidence of progression.  CT scan 09/18/21 shows no evidence of progression.  CT scan of the chest and abdomen done 10/29/2021 as well as MRI of the brain done on 10/29/2021 showed no evidence of disease, so she remains in a complete response.   CT 12/11/21 reveals NED, remains in complete response.  01/22/22 MRI brain NED  On 01/22/2022 she was dyspneic and desaturated with exertion. CT scan that day showed no pneumonitis.   03/05/22 scans stable, no concern for pneumonitis or new post-covid changes  05/28/22 CT stable. NED  07/09/22  MRI brain NED   08/20/2022 CT shows persistent tumor response. No significant toxicity. 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  If she has recurrence of the colitis then we will treat her with higher dose prednisone  with a 1 month taper and permanently stop the nivolumab as well.  11/12/22 CT reveals CR. 12/24/22 MRI brain NED  03/18/23 CT NED  Completed 2 years of immunotherapy in November 2024.  CT 06/09/2023, 09/08/23 shows no evidence of disease progression.  1. Metastatic adenocarcinoma.  Scans today stable.  She will return in 12 weeks for surveillance imaging (CT and MRI brain). She was advised to contact us  with any questions or concerns in the interim.  2. Diarrhea. On average 4-5 stools a day, improved with steroid tape previously (2-3x/day) now has returned. Will send in steroid taper to see if resolution of symptoms and low threshold for GI referral.  3. Left posterior thorax pain. Unclear etiology, no evidence of changes on scans. Sounds musculoskeletal in nature. Will see if steroids per above help. Will let us  know if not improved  Prescriptions provided: Requested Prescriptions    Signed Prescriptions Disp Refills  . predniSONE  (DELTASONE ) 10 MG tablet 30 tablet 0    Sig: Take 4 tablets (40 mg total) by mouth once daily for 3 days, THEN 3 tablets (30 mg total) once daily for 3 days, THEN 2 tablets (20 mg total) once daily for 3 days, THEN 1 tablet (10 mg total) once daily for 3 days.      Attestation Statement:   I personally performed the service, non-incident to. (WP)   TARA RAE HERRMANN, PA

## 2023-09-13 NOTE — Progress Notes (Signed)
 Subjective:   Chief Complaint  Patient presents with  . Shortness of Breath    Pt has history of lung cancer, is in remission. Over the weekend began having difficulty taking a full breath. Has had a collapsed lung before and states this feels the same. Called her doctor and they advised she get an xray to confirm same.      History of Present Illness The patient presents for evaluation of dyspnea.  She has a history of lung cancer and previously experienced a pneumothorax. Yesterday, she noticed a change in her breathing pattern, resembling the period when she had the pneumothorax. She struggles to catch her breath at rest, and this difficulty intensifies during physical activities such as walking or climbing stairs. She also reports left-sided lateral chest wall pain extending around her back, but it is not currently causing discomfort. Notes this is where she has previously had her lobectomy. She has no hx of injury, does not think it is a muscle strain. She is currently taking aspirin daily. She reports no history of blood clots or myocardial infarctions. Her blood pressure was elevated during today's visit, but she typically does not have hypertension. Her vital signs and blood work have always been within normal limits. She recalls that when she previously had the pneumothorax, she was unaware until it was diagnosed. She underwent a 29-month checkup last Wednesday, during which her CT scan showed no abnormalities.  MEDICATIONS Aspirin   Parts of patient history reviewed include PMH, problem list, medications, allergies, and social history.  Objective:   Vitals:   09/13/23 1118  BP: 152/88  Pulse: 51  Resp: 18  Temp: 97.9 F (36.6 C)  TempSrc: Tympanic  SpO2: 100%  Weight: 58.5 kg (129 lb)    Physical Exam Vitals reviewed.  Constitutional:      Appearance: Normal appearance.  HENT:     Head: Normocephalic and atraumatic.     Right Ear: External ear normal.     Left  Ear: External ear normal.     Mouth/Throat:     Mouth: Mucous membranes are moist.   Eyes:     Extraocular Movements: Extraocular movements intact.    Cardiovascular:     Rate and Rhythm: Regular rhythm. Bradycardia present.  Pulmonary:     Effort: Pulmonary effort is normal.     Breath sounds: Examination of the right-middle field reveals decreased breath sounds. Examination of the right-lower field reveals decreased breath sounds. Decreased breath sounds present.   Musculoskeletal:        General: Normal range of motion.     Cervical back: Normal range of motion and neck supple.     Right lower leg: No edema.     Left lower leg: No edema.   Skin:    General: Skin is warm and dry.   Neurological:     General: No focal deficit present.     Mental Status: She is alert and oriented to person, place, and time.            Assessment/Plan:   Sonya Hogan was seen today for shortness of breath.  Diagnoses and all orders for this visit:  Shortness of breath  Chest discomfort     Assessment & Plan Sonya Hogan is a 63 y.o. female is here for shortness of breath.  Afebrile and nontoxic appearing. O2 sat 100% on RA indicating excellent oxygenation. HR bradycardic, but similar to previous. BP mildly elevated again on recheck.  Clinic limited to do  only 1 view CXR due to problem with xray machine. Consider possible PE, ACS equivalent, pneumothorax. Given patient's past medical problems, will recommend further evaluation in the ED, patient agreeable to plan. Felt stable to transport via private vehicle.   All pertinent previous records reviewed prior to and during visit. Dyspnea.   Follow up with ED   Electronically signed by: Jon Earnie Flank, NP 09/13/2023 1:34 PM

## 2023-09-14 ENCOUNTER — Emergency Department (HOSPITAL_BASED_OUTPATIENT_CLINIC_OR_DEPARTMENT_OTHER): Admitting: Radiology

## 2023-09-14 ENCOUNTER — Other Ambulatory Visit: Payer: Self-pay

## 2023-09-14 ENCOUNTER — Encounter (HOSPITAL_BASED_OUTPATIENT_CLINIC_OR_DEPARTMENT_OTHER): Payer: Self-pay

## 2023-09-14 ENCOUNTER — Emergency Department (HOSPITAL_BASED_OUTPATIENT_CLINIC_OR_DEPARTMENT_OTHER)
Admission: EM | Admit: 2023-09-14 | Discharge: 2023-09-14 | Disposition: A | Discharge status: Home / Self Care | Attending: Emergency Medicine | Admitting: Emergency Medicine

## 2023-09-14 DIAGNOSIS — R197 Diarrhea, unspecified: Secondary | ICD-10-CM | POA: Diagnosis not present

## 2023-09-14 DIAGNOSIS — Z7952 Long term (current) use of systemic steroids: Secondary | ICD-10-CM | POA: Insufficient documentation

## 2023-09-14 DIAGNOSIS — Z7982 Long term (current) use of aspirin: Secondary | ICD-10-CM | POA: Insufficient documentation

## 2023-09-14 DIAGNOSIS — I2510 Atherosclerotic heart disease of native coronary artery without angina pectoris: Secondary | ICD-10-CM | POA: Insufficient documentation

## 2023-09-14 DIAGNOSIS — R0602 Shortness of breath: Secondary | ICD-10-CM | POA: Insufficient documentation

## 2023-09-14 DIAGNOSIS — Z7951 Long term (current) use of inhaled steroids: Secondary | ICD-10-CM | POA: Diagnosis not present

## 2023-09-14 DIAGNOSIS — J449 Chronic obstructive pulmonary disease, unspecified: Secondary | ICD-10-CM | POA: Diagnosis not present

## 2023-09-14 DIAGNOSIS — Z85118 Personal history of other malignant neoplasm of bronchus and lung: Secondary | ICD-10-CM | POA: Insufficient documentation

## 2023-09-14 LAB — TROPONIN T, HIGH SENSITIVITY
Troponin T High Sensitivity: <15 ng/L (ref <19)
Troponin T High Sensitivity: <15 ng/L (ref <19)

## 2023-09-14 LAB — BASIC METABOLIC PANEL WITH GFR
Anion gap: 12 (ref 5–15)
BUN: 15 mg/dL (ref 6–20)
CO2: 24 mmol/L (ref 22–32)
Calcium: 9.7 mg/dL (ref 8.9–10.3)
Chloride: 106 mmol/L (ref 98–111)
Creatinine, Ser: 0.91 mg/dL (ref 0.44–1.00)
GFR, Estimated: >60 mL/min (ref >60)
Glucose, Bld: 140 mg/dL — ABNORMAL HIGH (ref 70–99)
Potassium: 3.8 mmol/L (ref 3.5–5.1)
Sodium: 142 mmol/L (ref 135–145)

## 2023-09-14 LAB — CBC
HCT: 41.2 % (ref 36.0–46.0)
Hemoglobin: 14 g/dL (ref 12.0–15.0)
MCH: 31.2 pg (ref 26.0–34.0)
MCHC: 34 g/dL (ref 30.0–36.0)
MCV: 91.8 fL (ref 80.0–100.0)
Platelets: 272 K/uL (ref 150–400)
RBC: 4.49 MIL/uL (ref 3.87–5.11)
RDW: 13.2 % (ref 11.5–15.5)
WBC: 8.6 K/uL (ref 4.0–10.5)
nRBC: 0 % (ref 0.0–0.2)

## 2023-09-14 LAB — D-DIMER, QUANTITATIVE: D-Dimer, Quant: 0.39 ug{FEU}/mL (ref 0.00–0.50)

## 2023-09-14 MED ORDER — IPRATROPIUM-ALBUTEROL 0.5-2.5 (3) MG/3ML IN SOLN
6.0000 mL | Freq: Once | RESPIRATORY_TRACT | Status: AC
  Administered 2023-09-14: 6 mL via RESPIRATORY_TRACT

## 2023-09-14 MED ORDER — IPRATROPIUM-ALBUTEROL 0.5-2.5 (3) MG/3ML IN SOLN
RESPIRATORY_TRACT | Status: AC
  Filled 2023-09-14: qty 6

## 2023-09-14 NOTE — ED Provider Notes (Signed)
 [place] [facility] AT [facility] Provider Note   CSN: [id number] Arrival date & time: 09/14/23  1153     Patient presents with: Shortness of Breath   Sonya Hogan is a 63 y.o. female.   Patient is a 63 year old female with a history of stage IV lung cancer status post chemotherapy and immunotherapy who has been in remission since November, coronary atherosclerosis without history of MI or stents, COPD on daily trilogy who is presenting today with complaints of shortness of breath and an unusual sensation in the xiphoid area since Sunday.  Symptoms have been persistent.  Sometimes worse when laying down at night.  No swelling of the legs or extremities.  No new cough or fever.  She does report some mild heartburn which resolves with Tums but no nausea or vomiting.  Symptoms do not seem to be worse with eating.  She has not started any new medications.  She has not noticed any wheezing but has been clearing her throat some.  She was having some diarrhea and was put on prednisone  last week by her oncologist as they were thinking it was related to the prior immunotherapy drugs.  The history is provided by the patient and medical records.  Shortness of Breath      Prior to Admission medications   Medication Sig Start Date End Date Taking? Authorizing Provider  meloxicam (MOBIC) 15 MG tablet Take 15 mg by mouth daily. 08/25/23  Yes [provider]  predniSONE  (DELTASONE ) 10 MG tablet Take by mouth See admin instructions.  Take by mouth See Admin Instructions. TAKE 4 TABLETS (40 MG TOTAL) BY MOUTH ONCE DAILY FOR 3 DAYS THEN 3 TABLETS (30 MG TOTAL) ONCE DAILY FOR 3 DAYS THEN 2 TABLETS (20 MG TOTAL) ONCE DAILY FOR 3 DAYS THEN 1 TABLET (10 MG T 09/08/23 09/20/23 Yes [provider]  albuterol  (PROVENTIL  HFA;VENTOLIN  HFA) 108 (90 Base) MCG/ACT inhaler Inhale 1-2 puffs into the lungs every 6 (six) hours as needed for wheezing or shortness of breath. 08/24/15    Patt Alm Macho, MD  aspirin EC 81 MG tablet Take 81 mg by mouth at bedtime.    [provider]  atorvastatin (LIPITOR) 40 MG tablet Take 40 mg by mouth daily. 10/17/18   [provider]  carvedilol (COREG) 3.125 MG tablet Take 3.125 mg by mouth 2 (two) times daily. 08/12/18   [provider]  hydrOXYzine  (ATARAX /VISTARIL ) 25 MG tablet Take 2 tablets (50 mg total) by mouth every 6 (six) hours as needed for nausea or vomiting. 11/24/18   Pollina, Lonni PARAS, MD  metoCLOPramide  (REGLAN ) 10 MG tablet Take 1 tablet (10 mg total) by mouth every 6 (six) hours as needed for nausea or vomiting (nausea/headache). 11/24/18   Haze Lonni PARAS, MD  prochlorperazine (COMPAZINE) 10 MG tablet Take 1 tablet by mouth every 6 (six) hours as needed for nausea/vomiting. 10/21/18   [provider]  umeclidinium-vilanterol (ANORO ELLIPTA) 62.5-25 MCG/INH AEPB Inhale 1 puff into the lungs daily. 08/25/18   [provider]    Allergies: Chantix [varenicline] and Penicillins    Review of Systems  Respiratory:  Positive for shortness of breath.     Updated Vital Signs BP 107/60   Pulse 63   Temp 98.4 F (36.9 C)   Resp 17   Ht 5' 3 (1.6 m)   Wt 58.5 kg   SpO2 97%   BMI 22.85 kg/m   Physical Exam Vitals and nursing note reviewed.  Constitutional:      General: She is not in acute distress.    Appearance: She is well-developed.  HENT:     Head: Normocephalic and atraumatic.  Eyes:     Pupils: Pupils are equal, round, and reactive to light.  Cardiovascular:     Rate and Rhythm: Normal rate and regular rhythm.     Pulses: Normal pulses.     Heart sounds: Normal heart sounds. No murmur heard.    No friction rub.  Pulmonary:     Effort: Pulmonary effort is normal. Tachypnea present.     Breath sounds: Normal breath sounds. No wheezing or rales.  Abdominal:     General: Bowel sounds are normal. There is no distension.     Palpations: Abdomen is soft.      Tenderness: There is no abdominal tenderness. There is no guarding or rebound.  Musculoskeletal:        General: No tenderness. Normal range of motion.     Right lower leg: No tenderness. No edema.     Left lower leg: No tenderness. No edema.     Comments: No edema  Skin:    General: Skin is warm and dry.     Findings: No rash.  Neurological:     Mental Status: She is alert and oriented to person, place, and time.     Cranial Nerves: No cranial nerve deficit.  Psychiatric:        Behavior: Behavior normal.     (all labs ordered are listed, but only abnormal results are displayed) Labs Reviewed  BASIC METABOLIC PANEL WITH GFR - Abnormal; Notable for the following components:      Result Value   Glucose, Bld 140 (*)    All other components within normal limits  CBC  D-DIMER, QUANTITATIVE  TROPONIN T, HIGH SENSITIVITY  TROPONIN T, HIGH SENSITIVITY    EKG: EKG Interpretation Date/Time:  Tuesday September 14 2023 12:03:27 EDT Ventricular Rate:  61 PR Interval:  132 QRS Duration:  76 QT Interval:  418 QTC Calculation: 420 R Axis:   85  Text Interpretation: Normal sinus rhythm Cannot rule out Inferior infarct , age undetermined No significant change since last tracing When compared with ECG of 23-Nov-2018 21:25, PREVIOUS ECG IS PRESENT Confirmed by Doretha Folks ([id number]) on 09/14/2023 1:21:08 PM  Radiology: DG Chest 2 View Result Date: 09/14/2023 CLINICAL DATA:  sob EXAM: CHEST - 2 VIEW COMPARISON:  August 24, 2015 FINDINGS: No focal airspace consolidation, pleural effusion, or pneumothorax. No cardiomegaly.No acute fracture or destructive lesion. IMPRESSION: No acute cardiopulmonary abnormality. Electronically Signed   By: Rogelia Myers M.D.   On: 09/14/2023 12:53     Procedures   Medications Ordered in the ED  ipratropium-albuterol  (DUONEB) 0.5-2.5 (3) MG/3ML nebulizer solution (  Not Given 09/14/23 1214)  ipratropium-albuterol  (DUONEB) 0.5-2.5 (3) MG/3ML nebulizer  solution 6 mL (6 mLs Nebulization Given 09/14/23 1213)                                    Medical Decision Making Amount and/or Complexity of Data Reviewed Independent Historian: spouse External Data Reviewed: notes. Labs: ordered. Decision-making details documented in ED Course. Radiology: ordered and independent interpretation performed. Decision-making details documented in ED Course. ECG/medicine tests: ordered and independent interpretation performed. Decision-making details documented in ED Course.  Risk Prescription drug management.   Pt with multiple medical problems and comorbidities and presenting today  with a complaint that caries a high risk for morbidity and mortality.  Here today with complaints of some subxiphoid discomfort and shortness of breath.  Concern for pneumothorax, pneumonia, COPD exacerbation, PE, pericardial effusion, GI cause.  She has no abdominal discomfort concerning for cholecystitis, hepatitis, pancreatitis.  I independently interpreted patient's EKG and labs.  EKG shows a sinus rhythm without acute changes to prior EKGs.  CBC, D-dimer, troponin x 2, BMP all within normal limits.  After a DuoNeb patient reports she does not feel any different however she is no longer tachypneic and breathing more comfortably.  Good breath sounds in all lung fields.  I have independently visualized and interpreted pt's images today.  Chest x-ray without acute findings.  Vital signs are normal.  Discussing with the patient the results she did have a CT done less than a week ago which also showed no evidence of recurrence of her cancer and no pleural effusions.  At this time feel that patient is stable for discharge home but strongly urged her to call her cardiologist with [facility] for follow-up as she has not had an echo or stress test since 2023 since the cancer and treatment.  These were discussed with the patient and her husband.  She was given return precautions.  They feel  comfortable with this plan.  She was discharged in good condition.       Final diagnoses:  SOB (shortness of breath)    ED Discharge Orders     None          Doretha Folks, MD 09/14/23 1530

## 2023-09-14 NOTE — ED Notes (Signed)
 Reviewed discharge instructions and follow up care with pt. Pt verbalized understanding and had no further questions. Pt exited ED without complications.

## 2023-09-14 NOTE — Discharge Instructions (Addendum)
 The heart markers were normal today as well as your blood counts and kidney function.  Your x-ray did not show any signs of a collapsed lung and there is no fluid buildup around your heart.  However it would be a good idea to follow-up with your cardiologist to get a formal echo possibly even a stress test.  Your EKG today did look normal.  However in the meantime if you start having worsening shortness of breath, any passing out, the pain becomes crushing or moving in your chest or you start having persistent fever or vomiting return to the emergency room.

## 2023-09-14 NOTE — ED Triage Notes (Signed)
 Experiencing shortness of breath since Sunday. Hx cancer, now in remission. Hx L upper lobectemy. Also c/o chest pain since Sunday. 3/10 pressure with no radiation and heartburn. Denies nausea
